# Patient Record
Sex: Female | Born: 1980 | Race: White | Hispanic: No | Marital: Married | Smoking: Never smoker
Health system: Southern US, Community
[De-identification: ages and names within clinical notes are randomized; demographics above are authoritative.]

## PROBLEM LIST (undated history)

## (undated) ENCOUNTER — Inpatient Hospital Stay (HOSPITAL_COMMUNITY): Payer: BC Managed Care – PPO

## (undated) ENCOUNTER — Inpatient Hospital Stay (HOSPITAL_COMMUNITY): Payer: Self-pay

## (undated) DIAGNOSIS — R519 Headache, unspecified: Secondary | ICD-10-CM

## (undated) DIAGNOSIS — D649 Anemia, unspecified: Secondary | ICD-10-CM

## (undated) DIAGNOSIS — R51 Headache: Secondary | ICD-10-CM

## (undated) DIAGNOSIS — M629 Disorder of muscle, unspecified: Secondary | ICD-10-CM

## (undated) DIAGNOSIS — M2420 Disorder of ligament, unspecified site: Secondary | ICD-10-CM

## (undated) DIAGNOSIS — F329 Major depressive disorder, single episode, unspecified: Secondary | ICD-10-CM

## (undated) DIAGNOSIS — R112 Nausea with vomiting, unspecified: Secondary | ICD-10-CM

## (undated) DIAGNOSIS — F32A Depression, unspecified: Secondary | ICD-10-CM

## (undated) DIAGNOSIS — I341 Nonrheumatic mitral (valve) prolapse: Secondary | ICD-10-CM

## (undated) DIAGNOSIS — Z8619 Personal history of other infectious and parasitic diseases: Secondary | ICD-10-CM

## (undated) DIAGNOSIS — O9902 Anemia complicating childbirth: Secondary | ICD-10-CM

## (undated) DIAGNOSIS — N390 Urinary tract infection, site not specified: Secondary | ICD-10-CM

## (undated) DIAGNOSIS — Z8659 Personal history of other mental and behavioral disorders: Secondary | ICD-10-CM

## (undated) DIAGNOSIS — Z9889 Other specified postprocedural states: Secondary | ICD-10-CM

## (undated) DIAGNOSIS — F50029 Anorexia nervosa, binge eating/purging type, unspecified: Secondary | ICD-10-CM

## (undated) DIAGNOSIS — F5002 Anorexia nervosa, binge eating/purging type: Secondary | ICD-10-CM

## (undated) DIAGNOSIS — D6852 Prothrombin gene mutation: Secondary | ICD-10-CM

## (undated) HISTORY — DX: Anorexia nervosa, binge eating/purging type: F50.02

## (undated) HISTORY — DX: Depression, unspecified: F32.A

## (undated) HISTORY — DX: Disorder of ligament, unspecified site: M24.20

## (undated) HISTORY — PX: WISDOM TOOTH EXTRACTION: SHX21

## (undated) HISTORY — DX: Urinary tract infection, site not specified: N39.0

## (undated) HISTORY — DX: Prothrombin gene mutation: D68.52

## (undated) HISTORY — DX: Personal history of other infectious and parasitic diseases: Z86.19

## (undated) HISTORY — DX: Personal history of other mental and behavioral disorders: Z86.59

## (undated) HISTORY — DX: Major depressive disorder, single episode, unspecified: F32.9

## (undated) HISTORY — DX: Anorexia nervosa, binge eating/purging type, unspecified: F50.029

## (undated) HISTORY — DX: Anemia, unspecified: D64.9

## (undated) HISTORY — DX: Disorder of muscle, unspecified: M62.9

---

## 2002-11-05 DIAGNOSIS — I341 Nonrheumatic mitral (valve) prolapse: Secondary | ICD-10-CM

## 2002-11-05 HISTORY — DX: Nonrheumatic mitral (valve) prolapse: I34.1

## 2010-08-28 ENCOUNTER — Ambulatory Visit: Payer: Self-pay | Admitting: Family Medicine

## 2010-08-28 DIAGNOSIS — M629 Disorder of muscle, unspecified: Secondary | ICD-10-CM

## 2010-09-26 ENCOUNTER — Ambulatory Visit: Payer: Self-pay | Admitting: Internal Medicine

## 2010-09-26 DIAGNOSIS — T7819XA Other adverse food reactions, not elsewhere classified, initial encounter: Secondary | ICD-10-CM | POA: Insufficient documentation

## 2010-09-26 DIAGNOSIS — T781XXA Other adverse food reactions, not elsewhere classified, initial encounter: Secondary | ICD-10-CM | POA: Insufficient documentation

## 2010-09-26 DIAGNOSIS — N912 Amenorrhea, unspecified: Secondary | ICD-10-CM

## 2010-09-26 LAB — CONVERTED CEMR LAB: Beta hcg, urine, semiquantitative: POSITIVE

## 2010-11-05 ENCOUNTER — Inpatient Hospital Stay (HOSPITAL_COMMUNITY)
Admission: AD | Admit: 2010-11-05 | Discharge: 2010-11-05 | Payer: Self-pay | Source: Home / Self Care | Attending: Obstetrics and Gynecology | Admitting: Obstetrics and Gynecology

## 2010-12-05 NOTE — Assessment & Plan Note (Signed)
Summary: 1 M F/U,PREG TEST DLO R/S FROM 11/23-DR. C   Vital Signs:  Patient profile:   30 year old female Height:      64.5 inches Weight:      112.25 pounds BMI:     19.04 Temp:     99.0 degrees F oral Pulse rate:   80 / minute Pulse rhythm:   regular BP sitting:   112 / 64  (left arm) Cuff size:   regular  Vitals Entered By: Selena Batten Dance CMA Duncan Dull) (September 26, 2010 11:30 AM) CC: f/u on knee, pregnancy test    History of Present Illness: CC: f/u IT band syndrome, pregnancy test, allergies  presents with boyfriend.  1. L knee - seen 1 mo ago and dx with IT band syndrome, limited to 2 miles/day.  did stretching/strenghtening  exercises recommended.  Used chopat strap.  Now feeling much better, has run without chopat strap for 2 consecutive runs, still limiting to 2 mi/day.  training for 1/2 marathon in February with sister.  has steady plan to increase mileage slowly to 1/2 marathon with cross training/strength training as well.  realized along with foot person at [employer] that she was using arch support insole on top of previous shoe insole and thinks this is where pain came from.  now using one insole at a time.  2. late - LMP 08/20/2010.  2 wks late.  positive pregnancy test at home, would like rpt here.  first pregnancy.  not on prenatals.  desires pregnancy.  not in plan but has talked w/ boyfirend about possiblity.  planning on getting married.  no cats at home, does eat sliced [place] but not too many other cold cuts.  advised to cut back EtOH, not heavy caffeine drinker, non smoker.  3. allergies - some meat causes her to break out in hives.  ?additive in processed meat or cheaply prepared meat.  ? pork sausage vs beef.  + eyes swell shut.  + abd pain as well as skin pain from hives.  No SOB, trouble breathing or throat swelling.  + anxiety from this.  running/sweating seems to trigger reaction.    Sees Dr. Richardine Service at [facility] for GYN but she doesn't do OB, requests referral to  Raquel Sarna, female.  wants to deliver at [facility]  Current Medications (verified): 1)  None  Allergies: 1)  ! Sulfa  Past History:  Past medical, surgical, family and social histories (including risk factors) reviewed for relevance to current acute and chronic problems.  Past Medical History: Reviewed history from 08/28/2010 and no changes required. 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 n/c  Family History: Reviewed history from 08/28/2010 and no changes required. 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  Teeth in good repair.  MMM Neck:  No deformities, masses, or tenderness noted. Lungs:  Normal respiratory effort, chest expands symmetrically. Lungs are clear to auscultation, no crackles or wheezes. Heart:  Normal rate and regular rhythm. S1 and S2 normal without gallop, murmur, click, rub or other extra sounds. Msk:  bilateral knees  no deformity, no crepitus, no tenderness to palpation.  Negative drawer and mcmurray's bilaterally.  no ligament instability.  negative FABER test bilat.   Pulses:  2+ rad pulses Extremities:  no pedal edema Neurologic:  CN grossly intact, station and gait intact. Skin:  Intact without suspicious lesions or rashes Psych:  full affect, pleasant conversant and alert/cooperative.   Impression & Recommendations:  Problem # 1:  PREGNANCY, PRIMIGRAVIDA (ICD-V22.0) LMP 08/20/2010.  referral to OB.  pt prefers female provider, prefers in [place] to deliver in [facility].  start prenatals,  push water.  no cat exposure.  no smoking, stop EtOH, no caffeine.  questions answered.  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  send to allergist for eval.  pt desires to find out what she is allergic to.    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  advised may slowly increase mileage to use pain as guide, back down if pain.  if persists, to return for eval.  healthy, should be able to do 1/2 marathon even while pregnant as tolerates, will defer to OB.  Complete Medication List: 1)  Prenatal 12 Chew (Prenatal vit-fe fumarate-fa) .... One daily  Other Orders: Urine Pregnancy Test  (16109)  Patient Instructions: 1)  Start prenatals, more water during day. 2)  referral OBGYN. 3)  referral to allergist for evaluation of ?food allergy. 4)  Slowly increase training - with cross training as well. 5)  Use pain as guide, if returning, slow it down.  If pain remains, return to be evaluated. Prescriptions: PRENATAL 19  CHEW (PRENATAL VIT-FE FUMARATE-FA) one daily  #90 x 1   Entered and Authorized by:   Eustaquio Boyden  MD   Signed by:   Eustaquio Boyden  MD on 09/26/2010   Method used:   Electronically to        [facility]  [address] [phone]* (retail)       [address]       [place], [place]  [address]       Ph: [phone]       Fax: [phone]   RxID:   [id number]    Orders Added: 1)  Obstetric Referral [Obstetric] 2)  Est. Patient Level IV [02725] 3)  Allergy Referral  [Allergy] 4)  Urine Pregnancy Test  [81025]    Prior Medications: Current Allergies (reviewed today): ! SULFA  Laboratory Results   Urine Tests  Date/Time Received: September 26, 2010 11:33 AM Date/Time Reported: September 26, 2010 11:33 AM    Urine HCG: positive

## 2010-12-05 NOTE — Letter (Signed)
Summary: Patient Questionnaire  Patient Questionnaire   Imported By: Beau Fanny 08/29/2010 10:06:28  _____________________________________________________________________  External Attachment:    Type:   Image     Comment:   External Document

## 2010-12-05 NOTE — Assessment & Plan Note (Signed)
Summary: KNEE PAIN/DLO   Vital Signs:  Patient profile:   30 year old female Height:      64.5 inches Weight:      116.0 pounds BMI:     19.67 Temp:     99.6 degrees F oral Pulse rate:   72 / minute Pulse rhythm:   regular BP sitting:   110 / 80  (left arm) Cuff size:   regular  Vitals Entered By: Benny Lennert CMA Duncan Dull) (August 28, 2010 1:53 PM)  History of Present Illness: Chief complaint new patient to be established with Left knee pain  30 year old female:  Training for a 1/2 marathon in Feb.  Increased mileage recently, was able to go on 7 miles or so on long runs Now, had to stop going up to 3 miles Really started to hurt laterally on about mile 1 1/2.  Enough so had to limp home.  Hurts from 45 - 90 degrees of knee motion, particularly at the end of run and at termination.  Tried some warm   No locking up, no effusion.  Lateral L kne pain.   10-15  DA's office  Allergies (verified): 1)  ! 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 Otherwise the pertinent positives of the ROS are noted above.    Physical Exam  General:  GEN: Well-developed,well-nourished,in no acute distress; alert,appropriate and cooperative throughout examination HEENT: Normocephalic and atraumatic without obvious abnormalities. No apparent alopecia or balding. Ears, externally no deformities PULM: Breathing comfortably in no respiratory distress EXT: No clubbing, cyanosis, or edema PSYCH:  Normally interactive. Cooperative during the interview. Pleasant. Friendly and conversant. Not anxious or depressed appearing. Normal, full affect.  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 Given rehab program from the [employer] on ITBS rehab - primarily ITB stretching program, core stability program, gluteus medius strengthening, hip flexion strengthening, and core.   Reviewed plan of care and rehab is the primary treatment in this condition.  ITBS band  Modifiy running, speed and track work, decreased mileage for now.  Respond based on pain  Patient Instructions: 1)  Knee up and hold: each leg, hold 10 sec, 20 reps 2)  Knee up and open hip: knee up and externally rotate hip to open position, hold 2 sec and repeat 3)  each leg, 30 reps 4)  Hip abduction and hip flexion: 5)  3 x 30 6)  Add ankle weights when able 7)  ITB strap (CHO-PAT - YOU CAN GET FROM [employer]) 8)  f/u 1 month   Orders Added: 1)  New Patient Level III [09811]    Prior Medications (reviewed today): None Current Allergies (reviewed today): ! SULFA

## 2010-12-06 DEATH — deceased

## 2011-01-15 LAB — CBC
HCT: 35.6 % — ABNORMAL LOW (ref 36.0–46.0)
Hemoglobin: 12.5 g/dL (ref 12.0–15.0)
MCV: 90.8 fL (ref 78.0–100.0)
WBC: 7.1 10*3/uL (ref 4.0–10.5)

## 2011-01-15 LAB — TYPE AND SCREEN: Antibody Screen: NEGATIVE

## 2011-01-15 LAB — RAPID URINE DRUG SCREEN, HOSP PERFORMED: Barbiturates: NOT DETECTED

## 2011-01-15 LAB — ABO/RH: ABO/RH(D): O POS

## 2011-11-06 NOTE — L&D Delivery Note (Signed)
Delivery Note At 3:45 PM a viable and healthy female was delivered via Vaginal, Spontaneous Delivery (Presentation: Left Occiput Anterior).  APGAR: 9, 9; weight pending.   Placenta status: Intact, Spontaneous.  Cord:  with the following complications: None.  Cord pH: na  Anesthesia: Epidural  Episiotomy: None Lacerations: 3rd degree Suture Repair: 2.0 3.0 vicryl rapide and O Vicryl on sphincter Est. Blood Loss (mL): 500  Mom to postpartum.  Baby to nursery-stable.  Kelliann Pendergraph J 07/20/2012, 4:16 PM

## 2011-12-12 LAB — OB RESULTS CONSOLE GC/CHLAMYDIA
Chlamydia: NEGATIVE
Gonorrhea: NEGATIVE

## 2011-12-12 LAB — OB RESULTS CONSOLE RPR: RPR: NONREACTIVE

## 2011-12-12 LAB — OB RESULTS CONSOLE HIV ANTIBODY (ROUTINE TESTING): HIV: NONREACTIVE

## 2012-03-05 ENCOUNTER — Inpatient Hospital Stay (HOSPITAL_COMMUNITY): Admission: AD | Admit: 2012-03-05 | Payer: Self-pay | Source: Ambulatory Visit | Admitting: Obstetrics and Gynecology

## 2012-03-17 ENCOUNTER — Encounter: Payer: Self-pay | Admitting: Family Medicine

## 2012-03-18 ENCOUNTER — Ambulatory Visit (INDEPENDENT_AMBULATORY_CARE_PROVIDER_SITE_OTHER): Payer: BC Managed Care – PPO | Admitting: Family Medicine

## 2012-03-18 ENCOUNTER — Encounter: Payer: Self-pay | Admitting: Family Medicine

## 2012-03-18 VITALS — BP 112/64 | HR 84 | Temp 98.4°F | Wt 138.2 lb

## 2012-03-18 DIAGNOSIS — O26899 Other specified pregnancy related conditions, unspecified trimester: Secondary | ICD-10-CM

## 2012-03-18 DIAGNOSIS — S93409A Sprain of unspecified ligament of unspecified ankle, initial encounter: Secondary | ICD-10-CM

## 2012-03-18 DIAGNOSIS — R252 Cramp and spasm: Secondary | ICD-10-CM | POA: Insufficient documentation

## 2012-03-18 LAB — POTASSIUM: Potassium: 4 mEq/L (ref 3.5–5.1)

## 2012-03-18 LAB — MAGNESIUM: Magnesium: 1.8 mg/dL (ref 1.5–2.5)

## 2012-03-18 NOTE — Progress Notes (Signed)
  Subjective:    Patient ID: Chelsea Allen, female    DOB: [birth year], 31 y.o.   MRN: [id number]  HPI CC: left ankle pain  2 mo ago walking to mailbox, twisted ankle and fell down.  Remained swollen for several weeks.  Last week fell again, twisted same ankle.  Currently no pain but does stay more sensitive than otherwise.  Not taking any meds, didn't ice or raise leg.  First time did wrap for several days.  Also getting charlie horse sensation for last week at night in right calf.  Wakes her up, then sore the next day.  Happening every few days.  Review of Systems Per HPI    Objective:   Physical Exam  Nursing note and vitals reviewed. Constitutional: She appears well-developed and well-nourished. No distress.  Musculoskeletal: Normal range of motion. She exhibits no edema.       No palpable cords.  No calf tenderness. Tender to palpation left ATFL.  No ligament laxity noted with exam. Neg talar tilt. Neg squeeze test. No pain at base of 5th MT.  No pain at malleoli or at navicular.  Skin: Skin is warm and dry. No rash noted.  Psychiatric: She has a normal mood and affect.      Assessment & Plan:

## 2012-03-18 NOTE — Patient Instructions (Signed)
For ankle sprain - use ASO brace for next 3-4 weeks whenever active.  Do stretching exercises provided.  Ok to use tylenol during pregnancy if needed. For leg cramps - more fruits/vegetables.  Will check potassium and magnesium levels today. Let us know if not better.

## 2012-03-18 NOTE — Assessment & Plan Note (Signed)
Consistent with left lateral ankle sprain - ATFL. Resolving on own, but second sprain in matter of months. Discussed stretching/strengthening as well as working on proprioception but as 4 mo pregnant did not recommend balance training. Will place in ASO brace. Provided with stretching exercises from [facility] pt advisor.

## 2012-03-18 NOTE — Assessment & Plan Note (Signed)
Recommended more fruits/vegetables. Check K and Mg today.

## 2012-07-08 ENCOUNTER — Inpatient Hospital Stay (HOSPITAL_COMMUNITY)
Admission: AD | Admit: 2012-07-08 | Payer: BC Managed Care – PPO | Source: Ambulatory Visit | Admitting: Obstetrics and Gynecology

## 2012-07-16 ENCOUNTER — Telehealth (HOSPITAL_COMMUNITY): Payer: Self-pay | Admitting: *Deleted

## 2012-07-16 ENCOUNTER — Encounter (HOSPITAL_COMMUNITY): Payer: Self-pay | Admitting: *Deleted

## 2012-07-16 NOTE — Telephone Encounter (Signed)
Preadmission screen  

## 2012-07-17 ENCOUNTER — Other Ambulatory Visit: Payer: Self-pay | Admitting: Obstetrics and Gynecology

## 2012-07-19 ENCOUNTER — Inpatient Hospital Stay (HOSPITAL_COMMUNITY)
Admission: RE | Admit: 2012-07-19 | Discharge: 2012-07-22 | DRG: 373 | Disposition: A | Discharge status: Home / Self Care | Payer: BC Managed Care – PPO | Source: Ambulatory Visit | Attending: Obstetrics and Gynecology | Admitting: Obstetrics and Gynecology

## 2012-07-19 ENCOUNTER — Encounter (HOSPITAL_COMMUNITY): Payer: Self-pay

## 2012-07-19 DIAGNOSIS — O480 Post-term pregnancy: Principal | ICD-10-CM | POA: Diagnosis present

## 2012-07-19 DIAGNOSIS — D62 Acute posthemorrhagic anemia: Secondary | ICD-10-CM | POA: Diagnosis present

## 2012-07-19 DIAGNOSIS — O9902 Anemia complicating childbirth: Secondary | ICD-10-CM | POA: Diagnosis present

## 2012-07-19 DIAGNOSIS — O99892 Other specified diseases and conditions complicating childbirth: Secondary | ICD-10-CM | POA: Diagnosis present

## 2012-07-19 DIAGNOSIS — J209 Acute bronchitis, unspecified: Secondary | ICD-10-CM | POA: Diagnosis present

## 2012-07-19 HISTORY — DX: Anemia complicating childbirth: O99.02

## 2012-07-19 LAB — CBC
MCH: 28.5 pg (ref 26.0–34.0)
MCHC: 33.2 g/dL (ref 30.0–36.0)
MCV: 85.6 fL (ref 78.0–100.0)
Platelets: 193 10*3/uL (ref 150–400)
RDW: 16.2 % — ABNORMAL HIGH (ref 11.5–15.5)

## 2012-07-19 MED ORDER — ACETAMINOPHEN 325 MG PO TABS: 650.0000 mg | ORAL_TABLET | ORAL | Status: DC | PRN

## 2012-07-19 MED ORDER — IBUPROFEN 600 MG PO TABS
600.0000 mg | ORAL_TABLET | Freq: Four times a day (QID) | ORAL | Status: DC | PRN
  Administered 2012-07-20: 600 mg via ORAL
  Filled 2012-07-19: qty 1

## 2012-07-19 MED ORDER — OXYTOCIN 40 UNITS IN LACTATED RINGERS INFUSION - SIMPLE MED
1.0000 m[IU]/min | INTRAVENOUS | Status: DC
  Administered 2012-07-20: 2 m[IU]/min via INTRAVENOUS
  Filled 2012-07-19: qty 1000

## 2012-07-19 MED ORDER — TERBUTALINE SULFATE 1 MG/ML IJ SOLN: 0.2500 mg | Freq: Once | INTRAMUSCULAR | Status: AC | PRN

## 2012-07-19 MED ORDER — OXYCODONE-ACETAMINOPHEN 5-325 MG PO TABS: 1.0000 | ORAL_TABLET | ORAL | Status: DC | PRN

## 2012-07-19 MED ORDER — OXYTOCIN 40 UNITS IN LACTATED RINGERS INFUSION - SIMPLE MED: 62.5000 mL/h | Freq: Once | INTRAVENOUS | Status: DC

## 2012-07-19 MED ORDER — FLEET ENEMA 7-19 GM/118ML RE ENEM: 1.0000 | ENEMA | RECTAL | Status: DC | PRN

## 2012-07-19 MED ORDER — ONDANSETRON HCL 4 MG/2ML IJ SOLN
4.0000 mg | Freq: Four times a day (QID) | INTRAMUSCULAR | Status: DC | PRN
  Administered 2012-07-20: 4 mg via INTRAVENOUS
  Filled 2012-07-19: qty 2

## 2012-07-19 MED ORDER — LACTATED RINGERS IV SOLN: 500.0000 mL | INTRAVENOUS | Status: DC | PRN

## 2012-07-19 MED ORDER — CITRIC ACID-SODIUM CITRATE 334-500 MG/5ML PO SOLN: 30.0000 mL | ORAL | Status: DC | PRN

## 2012-07-19 MED ORDER — GUAIFENESIN 100 MG/5ML PO SOLN: 10.0000 mL | ORAL | Status: DC | PRN

## 2012-07-19 MED ORDER — LIDOCAINE HCL (PF) 1 % IJ SOLN
30.0000 mL | INTRAMUSCULAR | Status: DC | PRN
  Administered 2012-07-20: 30 mL via SUBCUTANEOUS
  Filled 2012-07-19: qty 30

## 2012-07-19 MED ORDER — ZOLPIDEM TARTRATE 5 MG PO TABS: 5.0000 mg | ORAL_TABLET | Freq: Every evening | ORAL | Status: DC | PRN

## 2012-07-19 MED ORDER — MISOPROSTOL 25 MCG QUARTER TABLET
25.0000 ug | ORAL_TABLET | ORAL | Status: DC | PRN
  Administered 2012-07-19: 25 ug via VAGINAL
  Filled 2012-07-19: qty 0.25

## 2012-07-19 MED ORDER — GUAIFENESIN 100 MG/5ML PO SYRP
200.0000 mg | ORAL_SOLUTION | ORAL | Status: DC | PRN
  Filled 2012-07-19: qty 118

## 2012-07-19 MED ORDER — BUTORPHANOL TARTRATE 1 MG/ML IJ SOLN
1.0000 mg | INTRAMUSCULAR | Status: DC | PRN
Start: 2012-07-19 — End: 2012-07-20
  Administered 2012-07-20: 1 mg via INTRAVENOUS
  Filled 2012-07-19: qty 1

## 2012-07-19 MED ORDER — OXYTOCIN BOLUS FROM INFUSION
500.0000 mL | Freq: Once | INTRAVENOUS | Status: AC
  Administered 2012-07-20: 500 mL via INTRAVENOUS
  Filled 2012-07-19: qty 500

## 2012-07-19 MED ORDER — LACTATED RINGERS IV SOLN
INTRAVENOUS | Status: DC
  Administered 2012-07-19 – 2012-07-20 (×2): via INTRAVENOUS

## 2012-07-19 NOTE — Progress Notes (Signed)
Chelsea Allen is a 31 y.o. G2P0010 at [redacted]w[redacted]d by LMP admitted for induction of labor due to Post dates. Due date 9/2.  Subjective: comfortable  Objective: BP 121/72  Pulse 94  Resp 20  Ht 5\' 4"  (1.626 m)  Wt 72.576 kg (160 lb)  BMI 27.46 kg/m2      FHT:  FHR: 155 bpm, variability: moderate,  accelerations:  Present,  decelerations:  Absent UC:   none SVE:   Dilation: Fingertip Effacement (%): Thick Station: -3 Exam by:: c soliz rn  Labs: Lab Results  Component Value Date   WBC 7.1 11/05/2010   HGB 12.5 11/05/2010   HCT 35.6* 11/05/2010   MCV 90.8 11/05/2010   PLT 193 11/05/2010    Assessment / Plan: Postdates IUP  Labor: Progressing normally Preeclampsia:  na Fetal Wellbeing:  Category I Pain Control:  Labor support without medications I/D:  n/a Anticipated MOD:  NSVD  Alvita Fana J 07/19/2012, 9:08 PM

## 2012-07-20 ENCOUNTER — Encounter (HOSPITAL_COMMUNITY): Payer: Self-pay

## 2012-07-20 ENCOUNTER — Encounter (HOSPITAL_COMMUNITY): Payer: Self-pay | Admitting: Anesthesiology

## 2012-07-20 ENCOUNTER — Inpatient Hospital Stay (HOSPITAL_COMMUNITY): Payer: BC Managed Care – PPO | Admitting: Anesthesiology

## 2012-07-20 LAB — RPR: RPR Ser Ql: NONREACTIVE

## 2012-07-20 MED ORDER — TETANUS-DIPHTH-ACELL PERTUSSIS 5-2.5-18.5 LF-MCG/0.5 IM SUSP: 0.5000 mL | Freq: Once | INTRAMUSCULAR | Status: DC

## 2012-07-20 MED ORDER — DIBUCAINE 1 % RE OINT: 1.0000 "application " | TOPICAL_OINTMENT | RECTAL | Status: DC | PRN

## 2012-07-20 MED ORDER — EPHEDRINE 5 MG/ML INJ
10.0000 mg | INTRAVENOUS | Status: DC | PRN
  Filled 2012-07-20: qty 4

## 2012-07-20 MED ORDER — PHENYLEPHRINE 40 MCG/ML (10ML) SYRINGE FOR IV PUSH (FOR BLOOD PRESSURE SUPPORT)
80.0000 ug | PREFILLED_SYRINGE | INTRAVENOUS | Status: DC | PRN
  Filled 2012-07-20: qty 5

## 2012-07-20 MED ORDER — SENNOSIDES-DOCUSATE SODIUM 8.6-50 MG PO TABS
2.0000 | ORAL_TABLET | Freq: Every day | ORAL | Status: DC
  Administered 2012-07-21: 2 via ORAL

## 2012-07-20 MED ORDER — SIMETHICONE 80 MG PO CHEW: 80.0000 mg | CHEWABLE_TABLET | ORAL | Status: DC | PRN

## 2012-07-20 MED ORDER — DIPHENHYDRAMINE HCL 50 MG/ML IJ SOLN: 12.5000 mg | INTRAMUSCULAR | Status: DC | PRN

## 2012-07-20 MED ORDER — BENZOCAINE-MENTHOL 20-0.5 % EX AERO
1.0000 "application " | INHALATION_SPRAY | CUTANEOUS | Status: DC | PRN
  Administered 2012-07-22: 1 via TOPICAL
  Filled 2012-07-20 (×2): qty 56

## 2012-07-20 MED ORDER — ZOLPIDEM TARTRATE 5 MG PO TABS: 5.0000 mg | ORAL_TABLET | Freq: Every evening | ORAL | Status: DC | PRN

## 2012-07-20 MED ORDER — DIPHENHYDRAMINE HCL 25 MG PO CAPS: 25.0000 mg | ORAL_CAPSULE | Freq: Four times a day (QID) | ORAL | Status: DC | PRN

## 2012-07-20 MED ORDER — EPHEDRINE 5 MG/ML INJ: 10.0000 mg | INTRAVENOUS | Status: DC | PRN

## 2012-07-20 MED ORDER — ONDANSETRON HCL 4 MG/2ML IJ SOLN: 4.0000 mg | INTRAMUSCULAR | Status: DC | PRN

## 2012-07-20 MED ORDER — METHYLERGONOVINE MALEATE 0.2 MG PO TABS: 0.2000 mg | ORAL_TABLET | ORAL | Status: DC | PRN

## 2012-07-20 MED ORDER — IBUPROFEN 600 MG PO TABS
600.0000 mg | ORAL_TABLET | Freq: Four times a day (QID) | ORAL | Status: DC
  Administered 2012-07-20 – 2012-07-21 (×2): 600 mg via ORAL
  Filled 2012-07-20 (×2): qty 1

## 2012-07-20 MED ORDER — WITCH HAZEL-GLYCERIN EX PADS: 1.0000 "application " | MEDICATED_PAD | CUTANEOUS | Status: DC | PRN

## 2012-07-20 MED ORDER — LIDOCAINE HCL (PF) 1 % IJ SOLN
INTRAMUSCULAR | Status: DC | PRN
  Administered 2012-07-20 (×2): 5 mL

## 2012-07-20 MED ORDER — FENTANYL 2.5 MCG/ML BUPIVACAINE 1/10 % EPIDURAL INFUSION (WH - ANES)
INTRAMUSCULAR | Status: DC | PRN
  Administered 2012-07-20: 14 mL/h via EPIDURAL

## 2012-07-20 MED ORDER — LACTATED RINGERS IV SOLN
500.0000 mL | Freq: Once | INTRAVENOUS | Status: AC
  Administered 2012-07-20: 1000 mL via INTRAVENOUS

## 2012-07-20 MED ORDER — ONDANSETRON HCL 4 MG PO TABS: 4.0000 mg | ORAL_TABLET | ORAL | Status: DC | PRN

## 2012-07-20 MED ORDER — METHYLERGONOVINE MALEATE 0.2 MG/ML IJ SOLN: 0.2000 mg | INTRAMUSCULAR | Status: DC | PRN

## 2012-07-20 MED ORDER — FENTANYL 2.5 MCG/ML BUPIVACAINE 1/10 % EPIDURAL INFUSION (WH - ANES)
14.0000 mL/h | INTRAMUSCULAR | Status: DC
  Administered 2012-07-20 (×2): 14 mL/h via EPIDURAL
  Filled 2012-07-20 (×3): qty 60

## 2012-07-20 MED ORDER — PHENYLEPHRINE 40 MCG/ML (10ML) SYRINGE FOR IV PUSH (FOR BLOOD PRESSURE SUPPORT): 80.0000 ug | PREFILLED_SYRINGE | INTRAVENOUS | Status: DC | PRN

## 2012-07-20 MED ORDER — PRENATAL MULTIVITAMIN CH
1.0000 | ORAL_TABLET | Freq: Every day | ORAL | Status: DC
  Administered 2012-07-21 – 2012-07-22 (×2): 1 via ORAL
  Filled 2012-07-20: qty 1

## 2012-07-20 MED ORDER — OXYCODONE-ACETAMINOPHEN 5-325 MG PO TABS: 1.0000 | ORAL_TABLET | ORAL | Status: DC | PRN

## 2012-07-20 MED ORDER — LANOLIN HYDROUS EX OINT: TOPICAL_OINTMENT | CUTANEOUS | Status: DC | PRN

## 2012-07-20 NOTE — Anesthesia Procedure Notes (Signed)
Epidural Patient location during procedure: OB Start time: 07/20/2012 3:35 AM  Staffing Anesthesiologist: Brayton Caves R Performed by: anesthesiologist   Preanesthetic Checklist Completed: patient identified, site marked, surgical consent, pre-op evaluation, timeout performed, IV checked, risks and benefits discussed and monitors and equipment checked  Epidural Patient position: sitting Prep: site prepped and draped and DuraPrep Patient monitoring: continuous pulse ox and blood pressure Approach: midline Injection technique: LOR air and LOR saline  Needle:  Needle type: Tuohy  Needle gauge: 17 G Needle length: 9 cm and 9 Needle insertion depth: 5 cm cm Catheter type: closed end flexible Catheter size: 19 Gauge Catheter at skin depth: 10 cm Test dose: negative  Assessment Events: blood not aspirated, injection not painful, no injection resistance, negative IV test and no paresthesia  Additional Notes Patient identified.  Risk benefits discussed including failed block, incomplete pain control, headache, nerve damage, paralysis, blood pressure changes, nausea, vomiting, reactions to medication both toxic or allergic, and postpartum back pain.  Patient expressed understanding and wished to proceed.  All questions were answered.  Sterile technique used throughout procedure and epidural site dressed with sterile barrier dressing. No paresthesia or other complications noted.The patient did not experience any signs of intravascular injection such as tinnitus or metallic taste in mouth nor signs of intrathecal spread such as rapid motor block. Please see nursing notes for vital signs.

## 2012-07-20 NOTE — Progress Notes (Signed)
Chelsea Allen is a 31 y.o. G2P0010 at [redacted]w[redacted]d by LMP admitted for induction of labor due to Post dates. Due date 9/4.  Subjective: Comfortable  Objective: BP 71/39  Pulse 108  Temp 97.7 F (36.5 C) (Oral)  Resp 18  Ht 5\' 4"  (1.626 m)  Wt 72.576 kg (160 lb)  BMI 27.46 kg/m2  SpO2 99%      FHT:  FHR: 155 bpm, variability: moderate,  accelerations:  Present,  decelerations:  Absent UC:   regular, every 2-3 minutes SVE:   Dilation: 8 Effacement (%): 100 Station: +1 Exam by:: Dorice Stiggers AROM-clear  Labs: Lab Results  Component Value Date   WBC 10.6* 07/19/2012   HGB 10.9* 07/19/2012   HCT 32.8* 07/19/2012   MCV 85.6 07/19/2012   PLT 193 07/19/2012    Assessment / Plan: Induction of labor due to postterm,  progressing well on pitocin  Labor: Progressing normally Preeclampsia:  na Fetal Wellbeing:  Category I Pain Control:  Epidural I/D:  n/a Anticipated MOD:  NSVD  Virl Coble J 07/20/2012, 12:58 PM

## 2012-07-20 NOTE — Anesthesia Postprocedure Evaluation (Signed)
  Anesthesia Post-op Note  Patient: Chelsea Allen  Procedure(s) Performed: * No procedures listed *  Patient Location: PACU and Mother/Baby  Anesthesia Type: Epidural  Level of Consciousness: awake, alert  and oriented  Airway and Oxygen Therapy: Patient Spontanous Breathing    Post-op Assessment: Patient's Cardiovascular Status Stable and Respiratory Function Stable  Post-op Vital Signs: stable  Complications: No apparent anesthesia complications

## 2012-07-20 NOTE — Anesthesia Preprocedure Evaluation (Signed)
Anesthesia Evaluation  Patient identified by MRN, date of birth, ID band Patient awake    Reviewed: Allergy & Precautions, H&P , Patient's Chart, lab work & pertinent test results  Airway Mallampati: II TM Distance: >3 FB Neck ROM: full    Dental No notable dental hx.    Pulmonary neg pulmonary ROS,  breath sounds clear to auscultation  Pulmonary exam normal       Cardiovascular negative cardio ROS  Rhythm:regular Rate:Normal     Neuro/Psych negative neurological ROS  negative psych ROS   GI/Hepatic negative GI ROS, Neg liver ROS,   Endo/Other  negative endocrine ROS  Renal/GU negative Renal ROS     Musculoskeletal   Abdominal   Peds  Hematology negative hematology ROS (+)   Anesthesia Other Findings Anorexia nervosa with bulimia in college none in 10+ years Other disorder of muscle, ligament, and fascia   IITS-left knee    Urinary tract infection, site not specified     Anemia        H/O varicella     Depression        Personal history of other mental disorder    Reproductive/Obstetrics (+) Pregnancy                           Anesthesia Physical Anesthesia Plan  ASA: II  Anesthesia Plan: Epidural   Post-op Pain Management:    Induction:   Airway Management Planned:   Additional Equipment:   Intra-op Plan:   Post-operative Plan:   Informed Consent: I have reviewed the patients History and Physical, chart, labs and discussed the procedure including the risks, benefits and alternatives for the proposed anesthesia with the patient or authorized representative who has indicated his/her understanding and acceptance.     Plan Discussed with:   Anesthesia Plan Comments:         Anesthesia Quick Evaluation

## 2012-07-21 LAB — CBC
MCH: 27.9 pg (ref 26.0–34.0)
MCV: 86.2 fL (ref 78.0–100.0)
Platelets: 170 10*3/uL (ref 150–400)
RDW: 16.9 % — ABNORMAL HIGH (ref 11.5–15.5)

## 2012-07-21 MED ORDER — SALINE SPRAY 0.65 % NA SOLN
1.0000 | NASAL | Status: DC | PRN
  Filled 2012-07-21: qty 44

## 2012-07-21 MED ORDER — DOCUSATE SODIUM 100 MG PO CAPS
100.0000 mg | ORAL_CAPSULE | Freq: Two times a day (BID) | ORAL | Status: DC
  Administered 2012-07-21 – 2012-07-22 (×2): 100 mg via ORAL
  Filled 2012-07-21 (×2): qty 1

## 2012-07-21 MED ORDER — IBUPROFEN 800 MG PO TABS
800.0000 mg | ORAL_TABLET | Freq: Three times a day (TID) | ORAL | Status: DC
  Administered 2012-07-21 – 2012-07-22 (×3): 800 mg via ORAL
  Filled 2012-07-21 (×3): qty 1

## 2012-07-21 MED ORDER — HYDROCOD POLST-CHLORPHEN POLST 10-8 MG/5ML PO LQCR
5.0000 mL | Freq: Two times a day (BID) | ORAL | Status: DC
  Administered 2012-07-21 – 2012-07-22 (×2): 5 mL via ORAL
  Filled 2012-07-21 (×3): qty 5

## 2012-07-21 MED ORDER — ALBUTEROL SULFATE HFA 108 (90 BASE) MCG/ACT IN AERS
2.0000 | INHALATION_SPRAY | RESPIRATORY_TRACT | Status: DC | PRN
  Administered 2012-07-21 – 2012-07-22 (×3): 2 via RESPIRATORY_TRACT
  Filled 2012-07-21: qty 6.7

## 2012-07-21 MED ORDER — TRAMADOL HCL 50 MG PO TABS
50.0000 mg | ORAL_TABLET | Freq: Four times a day (QID) | ORAL | Status: DC | PRN
  Administered 2012-07-21 – 2012-07-22 (×2): 50 mg via ORAL
  Filled 2012-07-21 (×2): qty 1

## 2012-07-21 MED ORDER — POLYSACCHARIDE IRON COMPLEX 150 MG PO CAPS
150.0000 mg | ORAL_CAPSULE | Freq: Every day | ORAL | Status: DC
  Administered 2012-07-22: 150 mg via ORAL
  Filled 2012-07-21 (×2): qty 1

## 2012-07-21 NOTE — Progress Notes (Signed)
PPD 1 SVD with 3rd degree -partial 4th  S:  Reports feeling tired and really sore             Tolerating po/ No nausea or vomiting             Bleeding is light             Pain controlled with motrin but wearing off early - does not want percocet (too sedating)             Up ad lib / ambulatory  Newborn breast feeding  / Circumcision - unsure - considering   O:  A & O x 3              VS: Blood pressure 111/69, pulse 83, temperature 98.7 F (37.1 C), temperature source Axillary, resp. rate 18, height 5\' 4"  (1.626 m), weight 72.576 kg (160 lb), SpO2 99.00%, unknown if currently breastfeeding.  LABS: WBC/Hgb/Hct/Plts:  18.5/8.5/26.3/170 (09/16 0500)   Lungs: Clear and unlabored  Heart: regular rate and rhythm  Abdomen: soft, non-tender, non-distended              Fundus: firm, non-tender, U-1  Perineum: moderate edema / ice pack in place  Lochia: light  Extremities: no edema, no calf pain or tenderness    A: PPD # 1 SVD with 3rd and partial 4th degree LAC             URI - likely viral etiology - acute onset with cough             ABL anemia from childbirth             Inadequate pain control  Doing well - stable status  P:  Routine post partum orders  Start colace 100 mg BID to TID x 6 weeks - avoid constipation / Magnesium 200-400 mg OTC at discharge             Increase water intake             Iron - slow release every other day x 1 month             Tussionex for cough every 12 hours / saline spray for nasal congestion             Increase motrin dose to 800 mg and changed percocet to ultram for pain  Marlinda Mike CNM, MSN 07/21/2012, 9:17 AM

## 2012-07-22 ENCOUNTER — Encounter (HOSPITAL_COMMUNITY): Payer: Self-pay

## 2012-07-22 DIAGNOSIS — O9902 Anemia complicating childbirth: Secondary | ICD-10-CM | POA: Diagnosis present

## 2012-07-22 HISTORY — DX: Anemia complicating childbirth: O99.02

## 2012-07-22 MED ORDER — TRAMADOL HCL 50 MG PO TABS: 50.0000 mg | ORAL_TABLET | Freq: Four times a day (QID) | ORAL | Status: DC | PRN

## 2012-07-22 MED ORDER — IBUPROFEN 800 MG PO TABS: 800.0000 mg | ORAL_TABLET | Freq: Three times a day (TID) | ORAL | Status: DC

## 2012-07-22 MED ORDER — BREAST PUMP MISC: 1.0000 [IU] | Status: DC | PRN

## 2012-07-22 MED ORDER — INFLUENZA VIRUS VACC SPLIT PF IM SUSP
0.5000 mL | INTRAMUSCULAR | Status: AC | PRN
  Administered 2012-07-22: 0.5 mL via INTRAMUSCULAR
  Filled 2012-07-22: qty 0.5

## 2012-07-22 MED ORDER — DSS 100 MG PO CAPS: 100.0000 mg | ORAL_CAPSULE | Freq: Two times a day (BID) | ORAL | Status: DC

## 2012-07-22 MED ORDER — ALBUTEROL SULFATE HFA 108 (90 BASE) MCG/ACT IN AERS: 2.0000 | INHALATION_SPRAY | RESPIRATORY_TRACT | Status: DC | PRN

## 2012-07-22 MED ORDER — POLYSACCHARIDE IRON COMPLEX 150 MG PO CAPS: 150.0000 mg | ORAL_CAPSULE | Freq: Every day | ORAL | Status: DC

## 2012-07-22 NOTE — Progress Notes (Signed)
Patient does not still have iv or epidural cath.  They were taken out prior to 1900

## 2012-07-22 NOTE — Progress Notes (Signed)
Pt discharged before Sw could assess history of depression. 

## 2012-07-22 NOTE — Discharge Summary (Signed)
Obstetric Discharge Summary Reason for Admission: induction of labor and post dates Prenatal Procedures: NST and ultrasound Intrapartum Procedures: spontaneous vaginal delivery and epidural Postpartum Procedures: Flu vaccine Complications-Operative and Postpartum: 3rd degree laceration Hemoglobin  Date Value Range Status  07/21/2012 8.5* 12.0 - 15.0 g/dL Final     REPEATED TO VERIFY     DELTA CHECK NOTED     HCT  Date Value Range Status  07/21/2012 26.3* 36.0 - 46.0 % Final    Physical Exam:  General: alert, cooperative and no distress Lochia: appropriate Uterine Fundus: firm Incision: healing well, no dehiscence, no significant erythema DVT Evaluation: Negative Homan's sign. No significant calf/ankle edema.  Discharge Diagnoses: Term Pregnancy-delivered and acute bronchitis, materna anemia, symptomatic  Discharge Information: Date: 07/22/2012 Activity: pelvic rest Diet: routine Medications: PNV, Ibuprofen, Colace, Iron and Ultram Condition: stable Instructions: refer to practice specific booklet Discharge to: home Follow-up Information    Follow up with Lenoard Aden, MD. Schedule an appointment as soon as possible for a visit in 6 weeks.   Contact information:   [address] [place] [address] [phone]          Newborn Data: Live born female "Zane" Birth Weight: 8 lb 4.5 oz (3755 g) APGAR: 9, 9  Home with mother.  PAUL,DANIELA 07/22/2012, 9:54 AM

## 2012-07-22 NOTE — Progress Notes (Signed)
Post Partum Day #2            Information for the patient's newborn:  Agustina, Witzke [[id number]]  female   / circumcision done Feeding: breast  Subjective: No HA, SOB, CP, F/C, breast symptoms. Pain controlled in perineum. L rib pain w/ coughing worsening Tussionex wearing off too quickly, desires to resume codeine cough syrup. Normal vaginal bleeding, no clots.      Objective:  Temp:  [97.9 F (36.6 C)-98.6 F (37 C)] 97.9 F (36.6 C) (09/17 0629) Pulse Rate:  [73-83] 73  (09/17 0629) Resp:  [18] 18  (09/17 0629) BP: (108-118)/(64-78) 108/78 mmHg (09/17 0629)  No intake or output data in the 24 hours ending 07/22/12 0934     Basename 07/21/12 0500 07/19/12 2020  WBC 18.5* 10.6*  HGB 8.5* 10.9*  HCT 26.3* 32.8*  PLT 170 193    Blood type: O/Positive/-- (02/06 0000) Rubella: Immune (02/06 0000)    Physical Exam:  General: alert, cooperative and no distress Lungs: CTAB, mild wheezing over main bilat bronchi Uterine Fundus: firm Lochia: appropriate Perineum: repair intact, edema minimal DVT Evaluation: Negative Homan's sign. No significant calf/ankle edema.    Assessment/Plan: PPD # 2 / 31 y.o., G2P1011 S/P:induced vaginal   Active Problems:  Postpartum care following vaginal delivery w 3rd degree (9/15)  vaginal delivery w 3rd degree (9/15)  Maternal anemia, with delivery Acute bronchitis-persistent cough, minimal relief from Tussionex and saline drops. No evidence of pneumonia on clinical exam, MSK pain from strain in L lower rib margin   normal postpartum exam  Bowel regimen and stool softeners daily for 3rd deg tear  Oral Fe supplement x 4-6 wks  Resume cough syrup w/ codeine four persistent cough, pt to call if worsening s/s at home  Continue current postpartum care  D/C home   LOS: 3 days   PAUL,DANIELA, CNM, MSN 07/22/2012, 9:34 AM

## 2014-07-30 LAB — OB RESULTS CONSOLE RPR: RPR: NONREACTIVE

## 2014-07-30 LAB — OB RESULTS CONSOLE HEPATITIS B SURFACE ANTIGEN: Hepatitis B Surface Ag: NEGATIVE

## 2014-07-30 LAB — OB RESULTS CONSOLE ABO/RH: RH Type: POSITIVE

## 2014-07-30 LAB — OB RESULTS CONSOLE ANTIBODY SCREEN: ANTIBODY SCREEN: NEGATIVE

## 2014-07-30 LAB — OB RESULTS CONSOLE GC/CHLAMYDIA: Gonorrhea: NEGATIVE

## 2014-07-30 LAB — OB RESULTS CONSOLE RUBELLA ANTIBODY, IGM: RUBELLA: IMMUNE

## 2014-07-30 LAB — OB RESULTS CONSOLE HIV ANTIBODY (ROUTINE TESTING): HIV: NONREACTIVE

## 2014-09-06 ENCOUNTER — Encounter (HOSPITAL_COMMUNITY): Payer: Self-pay

## 2014-11-05 NOTE — L&D Delivery Note (Signed)
Delivery Note At 2:00 PM a viable and healthy female was delivered via Vaginal, Spontaneous Delivery (Presentation: Right Occiput Anterior).  APGAR: 9, 9; weight pending .   Placenta status: Intact, Spontaneous.  Cord: 3 vessels with the following complications: None.  Cord pH: na  Anesthesia: Epidural  Episiotomy:  none Lacerations:  Third degree Suture Repair: 3.0 vicryl rapide and 0 vicryl Est. Blood Loss (mL):  300  Mom to postpartum.  Baby to Couplet care / Skin to Skin.  Adelita Hone J 03/07/2015, 2:23 PM

## 2015-02-02 LAB — OB RESULTS CONSOLE GBS: STREP GROUP B AG: NEGATIVE

## 2015-03-07 ENCOUNTER — Inpatient Hospital Stay (HOSPITAL_COMMUNITY): Payer: BC Managed Care – PPO | Admitting: Anesthesiology

## 2015-03-07 ENCOUNTER — Encounter (HOSPITAL_COMMUNITY): Payer: Self-pay | Admitting: *Deleted

## 2015-03-07 ENCOUNTER — Inpatient Hospital Stay (HOSPITAL_COMMUNITY)
Admission: AD | Admit: 2015-03-07 | Discharge: 2015-03-08 | DRG: 988 | Disposition: A | Discharge status: Home / Self Care | Payer: BC Managed Care – PPO | Source: Ambulatory Visit | Attending: Obstetrics and Gynecology | Admitting: Obstetrics and Gynecology

## 2015-03-07 DIAGNOSIS — Z3A41 41 weeks gestation of pregnancy: Secondary | ICD-10-CM | POA: Diagnosis present

## 2015-03-07 DIAGNOSIS — O9902 Anemia complicating childbirth: Secondary | ICD-10-CM | POA: Diagnosis present

## 2015-03-07 DIAGNOSIS — O480 Post-term pregnancy: Secondary | ICD-10-CM | POA: Diagnosis present

## 2015-03-07 DIAGNOSIS — Z3483 Encounter for supervision of other normal pregnancy, third trimester: Secondary | ICD-10-CM | POA: Diagnosis present

## 2015-03-07 HISTORY — DX: Nonrheumatic mitral (valve) prolapse: I34.1

## 2015-03-07 LAB — TYPE AND SCREEN
ABO/RH(D): O POS
ANTIBODY SCREEN: NEGATIVE

## 2015-03-07 LAB — CBC
HCT: 31.8 % — ABNORMAL LOW (ref 36.0–46.0)
HEMOGLOBIN: 10.4 g/dL — AB (ref 12.0–15.0)
MCH: 26.7 pg (ref 26.0–34.0)
MCHC: 32.7 g/dL (ref 30.0–36.0)
MCV: 81.5 fL (ref 78.0–100.0)
Platelets: 168 10*3/uL (ref 150–400)
RBC: 3.9 MIL/uL (ref 3.87–5.11)
RDW: 15.7 % — ABNORMAL HIGH (ref 11.5–15.5)
WBC: 9.6 10*3/uL (ref 4.0–10.5)

## 2015-03-07 LAB — RPR: RPR: NONREACTIVE

## 2015-03-07 MED ORDER — OXYCODONE-ACETAMINOPHEN 5-325 MG PO TABS: 1.0000 | ORAL_TABLET | ORAL | Status: DC | PRN

## 2015-03-07 MED ORDER — ONDANSETRON HCL 4 MG/2ML IJ SOLN: 4.0000 mg | INTRAMUSCULAR | Status: DC | PRN

## 2015-03-07 MED ORDER — DIPHENHYDRAMINE HCL 25 MG PO CAPS: 25.0000 mg | ORAL_CAPSULE | Freq: Four times a day (QID) | ORAL | Status: DC | PRN

## 2015-03-07 MED ORDER — LIDOCAINE HCL (PF) 1 % IJ SOLN
30.0000 mL | INTRAMUSCULAR | Status: DC | PRN
  Filled 2015-03-07: qty 30

## 2015-03-07 MED ORDER — FLEET ENEMA 7-19 GM/118ML RE ENEM: 1.0000 | ENEMA | RECTAL | Status: DC | PRN

## 2015-03-07 MED ORDER — EPHEDRINE 5 MG/ML INJ
10.0000 mg | INTRAVENOUS | Status: DC | PRN
Start: 2015-03-07 — End: 2015-03-07
  Filled 2015-03-07: qty 2

## 2015-03-07 MED ORDER — LANOLIN HYDROUS EX OINT: TOPICAL_OINTMENT | CUTANEOUS | Status: DC | PRN

## 2015-03-07 MED ORDER — SIMETHICONE 80 MG PO CHEW: 80.0000 mg | CHEWABLE_TABLET | ORAL | Status: DC | PRN

## 2015-03-07 MED ORDER — LACTATED RINGERS IV SOLN
INTRAVENOUS | Status: DC
Start: 2015-03-07 — End: 2015-03-07

## 2015-03-07 MED ORDER — CITRIC ACID-SODIUM CITRATE 334-500 MG/5ML PO SOLN: 30.0000 mL | ORAL | Status: DC | PRN

## 2015-03-07 MED ORDER — BUTORPHANOL TARTRATE 1 MG/ML IJ SOLN
1.0000 mg | INTRAMUSCULAR | Status: DC | PRN
  Administered 2015-03-07: 1 mg via INTRAVENOUS
  Filled 2015-03-07: qty 1

## 2015-03-07 MED ORDER — DIPHENHYDRAMINE HCL 50 MG/ML IJ SOLN: 12.5000 mg | INTRAMUSCULAR | Status: DC | PRN

## 2015-03-07 MED ORDER — ACETAMINOPHEN 325 MG PO TABS: 650.0000 mg | ORAL_TABLET | ORAL | Status: DC | PRN

## 2015-03-07 MED ORDER — OXYCODONE-ACETAMINOPHEN 5-325 MG PO TABS: 2.0000 | ORAL_TABLET | ORAL | Status: DC | PRN

## 2015-03-07 MED ORDER — WITCH HAZEL-GLYCERIN EX PADS: 1.0000 "application " | MEDICATED_PAD | CUTANEOUS | Status: DC | PRN

## 2015-03-07 MED ORDER — OXYTOCIN 40 UNITS IN LACTATED RINGERS INFUSION - SIMPLE MED
62.5000 mL/h | INTRAVENOUS | Status: DC
  Filled 2015-03-07: qty 1000

## 2015-03-07 MED ORDER — PHENYLEPHRINE 40 MCG/ML (10ML) SYRINGE FOR IV PUSH (FOR BLOOD PRESSURE SUPPORT)
80.0000 ug | PREFILLED_SYRINGE | INTRAVENOUS | Status: DC | PRN
Start: 2015-03-07 — End: 2015-03-07
  Filled 2015-03-07: qty 20
  Filled 2015-03-07: qty 2

## 2015-03-07 MED ORDER — METHYLERGONOVINE MALEATE 0.2 MG/ML IJ SOLN: 0.2000 mg | INTRAMUSCULAR | Status: DC | PRN

## 2015-03-07 MED ORDER — SENNOSIDES-DOCUSATE SODIUM 8.6-50 MG PO TABS
2.0000 | ORAL_TABLET | ORAL | Status: DC
  Administered 2015-03-07: 2 via ORAL
  Filled 2015-03-07: qty 2

## 2015-03-07 MED ORDER — DIBUCAINE 1 % RE OINT: 1.0000 "application " | TOPICAL_OINTMENT | RECTAL | Status: DC | PRN

## 2015-03-07 MED ORDER — METHYLERGONOVINE MALEATE 0.2 MG PO TABS: 0.2000 mg | ORAL_TABLET | ORAL | Status: DC | PRN

## 2015-03-07 MED ORDER — OXYTOCIN BOLUS FROM INFUSION
500.0000 mL | INTRAVENOUS | Status: DC
  Administered 2015-03-07: 500 mL via INTRAVENOUS

## 2015-03-07 MED ORDER — ONDANSETRON HCL 4 MG/2ML IJ SOLN: 4.0000 mg | Freq: Four times a day (QID) | INTRAMUSCULAR | Status: DC | PRN

## 2015-03-07 MED ORDER — LACTATED RINGERS IV SOLN
500.0000 mL | INTRAVENOUS | Status: DC | PRN
  Administered 2015-03-07: 1000 mL via INTRAVENOUS

## 2015-03-07 MED ORDER — IBUPROFEN 600 MG PO TABS
600.0000 mg | ORAL_TABLET | Freq: Four times a day (QID) | ORAL | Status: DC
  Administered 2015-03-07 – 2015-03-08 (×5): 600 mg via ORAL
  Filled 2015-03-07 (×5): qty 1

## 2015-03-07 MED ORDER — TETANUS-DIPHTH-ACELL PERTUSSIS 5-2.5-18.5 LF-MCG/0.5 IM SUSP: 0.5000 mL | Freq: Once | INTRAMUSCULAR | Status: DC

## 2015-03-07 MED ORDER — PRENATAL MULTIVITAMIN CH
1.0000 | ORAL_TABLET | Freq: Every day | ORAL | Status: DC
  Administered 2015-03-08: 1 via ORAL
  Filled 2015-03-07: qty 1

## 2015-03-07 MED ORDER — FENTANYL 2.5 MCG/ML BUPIVACAINE 1/10 % EPIDURAL INFUSION (WH - ANES)
14.0000 mL/h | INTRAMUSCULAR | Status: DC | PRN
  Administered 2015-03-07: 14 mL/h via EPIDURAL
  Filled 2015-03-07: qty 125

## 2015-03-07 MED ORDER — BENZOCAINE-MENTHOL 20-0.5 % EX AERO
1.0000 "application " | INHALATION_SPRAY | CUTANEOUS | Status: DC | PRN
  Administered 2015-03-07: 1 via TOPICAL
  Filled 2015-03-07: qty 56

## 2015-03-07 MED ORDER — ONDANSETRON HCL 4 MG PO TABS: 4.0000 mg | ORAL_TABLET | ORAL | Status: DC | PRN

## 2015-03-07 MED ORDER — OXYCODONE-ACETAMINOPHEN 5-325 MG PO TABS
1.0000 | ORAL_TABLET | ORAL | Status: DC | PRN
  Filled 2015-03-07: qty 1

## 2015-03-07 MED ORDER — ZOLPIDEM TARTRATE 5 MG PO TABS: 5.0000 mg | ORAL_TABLET | Freq: Every evening | ORAL | Status: DC | PRN

## 2015-03-07 MED ORDER — ACETAMINOPHEN 325 MG PO TABS
650.0000 mg | ORAL_TABLET | ORAL | Status: DC | PRN
  Administered 2015-03-08: 650 mg via ORAL
  Filled 2015-03-07: qty 2

## 2015-03-07 NOTE — H&P (Signed)
Chelsea Allen is a 34 y.o. female presenting for labor.  Maternal Medical History:  Reason for admission: Contractions.   Contractions: Onset was 3-5 hours ago.   Frequency: regular.   Perceived severity is moderate.    Fetal activity: Perceived fetal activity is normal.   Last perceived fetal movement was within the past hour.    Prenatal complications: no prenatal complications Prenatal Complications - Diabetes: none.    OB History    Gravida Para Term Preterm AB TAB SAB Ectopic Multiple Living   3 1 1  1  1   1      Past Medical History  Diagnosis Date  . Anorexia nervosa with bulimia in college    none in 10+ years  . Other disorder of muscle, ligament, and fascia     IITS-left knee  . Urinary tract infection, site not specified   . Anemia   . H/O varicella   . Depression   . Personal history of other mental disorder     anorexia nervosa  . Maternal anemia, with delivery 07/22/2012  . Mitral valve prolapse 2004   Past Surgical History  Procedure Laterality Date  . Wisdom tooth extraction     Family History: family history includes Cancer in her paternal grandfather; Depression in her maternal grandfather, mother, and sister. Social History:  reports that she has never smoked. She has never used smokeless tobacco. She reports that she does not drink alcohol or use illicit drugs.   Prenatal Transfer Tool  Maternal Diabetes: No Genetic Screening: Normal Maternal Ultrasounds/Referrals: Normal Fetal Ultrasounds or other Referrals:  None Maternal Substance Abuse:  No Significant Maternal Medications:  None Significant Maternal Lab Results:  None Other Comments:  None  Review of Systems  Constitutional: Negative.   All other systems reviewed and are negative.   Dilation: 3 Effacement (%): 60 Station: -1 Exam by:: Normand SloopK. Jones, RN  Blood pressure 95/51, pulse 72, temperature 97.9 F (36.6 C), temperature source Oral, resp. rate 20, height 5\' 4"  (1.626 m),  weight 68.04 kg (150 lb), SpO2 100 %, unknown if currently breastfeeding. Maternal Exam:  Uterine Assessment: Contraction strength is moderate.  Contraction frequency is regular.   Abdomen: Patient reports no abdominal tenderness. Fetal presentation: vertex  Introitus: Normal vulva. Normal vagina.  Ferning test: negative.  Nitrazine test: negative. Amniotic fluid character: not assessed.  Pelvis: adequate for delivery.   Cervix: Cervix evaluated by digital exam.     Physical Exam  Constitutional: She is oriented to person, place, and time. She appears well-developed and well-nourished.  Neck: Normal range of motion. Neck supple.  Cardiovascular: Normal rate and regular rhythm.   Respiratory: Effort normal and breath sounds normal.  GI: Soft. Bowel sounds are normal.  Genitourinary: Vagina normal and uterus normal.  Musculoskeletal: Normal range of motion.  Neurological: She is alert and oriented to person, place, and time. She has normal reflexes.  Skin: Skin is warm and dry.  Psychiatric: She has a normal mood and affect. Her behavior is normal. Judgment and thought content normal.    Prenatal labs: ABO, Rh: --/--/O POS (05/02 0330) Antibody: NEG (05/02 0330) Rubella: Immune (09/25 0000) RPR: Nonreactive (09/25 0000)  HBsAg: Negative (09/25 0000)  HIV: Non-reactive (09/25 0000)  GBS: Negative (03/30 0000)   Assessment/Plan: Postdates IUP Early labor Admit Anticipate VD   Chelsea Allen 03/07/2015, 7:09 AM

## 2015-03-07 NOTE — Lactation Note (Signed)
This note was copied from the chart of Boy Veverly FellsMeredith Laningham. Lactation Consultation Note Initial visit at 7 hours of age.  Mom reports just finishing a good feeding.  Mom denies pain with latch.  FOB holding baby STS.  [facility] resources given and discussed.  Encouraged to feed with early cues on demand.  Early newborn behavior discussed.  Hand expression demonstrated with colostrum visible.  Mom to call for assist as needed.     Patient Name: Boy Veverly FellsMeredith Scahill BJYNW'GToday's Date: 03/07/2015 Reason for consult: Initial assessment   Maternal Data Has patient been taught Hand Expression?: Yes  Feeding Feeding Type: Breast Fed Length of feed: 15 min  LATCH Score/Interventions Latch: Repeated attempts needed to sustain latch, nipple held in mouth throughout feeding, stimulation needed to elicit sucking reflex. Intervention(s): Skin to skin;Waking techniques  Audible Swallowing: None Intervention(s): Skin to skin;Hand expression  Type of Nipple: Flat  Comfort (Breast/Nipple): Soft / non-tender     Hold (Positioning): Assistance needed to correctly position infant at breast and maintain latch.  LATCH Score: 5  Lactation Tools Discussed/Used     Consult Status Consult Status: Follow-up Date: 03/08/15 Follow-up type: In-patient    Beverely RisenShoptaw, Arvella MerlesJana Lynn 03/07/2015, 11:37 PM

## 2015-03-07 NOTE — Anesthesia Procedure Notes (Addendum)
Epidural Patient location during procedure: OB  Staffing Anesthesiologist: Beatrice Sehgal, CHRIS Performed by: anesthesiologist   Preanesthetic Checklist Completed: patient identified, surgical consent, pre-op evaluation, timeout performed, IV checked, risks and benefits discussed and monitors and equipment checked  Epidural Patient position: sitting Prep: site prepped and draped and DuraPrep Patient monitoring: heart rate, cardiac monitor, continuous pulse ox and blood pressure Approach: midline Location: L3-L4 Injection technique: LOR saline  Needle:  Needle type: Tuohy  Needle gauge: 17 G Needle length: 9 cm Needle insertion depth: 6 cm Catheter type: closed end flexible Catheter size: 19 Gauge Catheter at skin depth: 12 cm Test dose: negative and 2% lidocaine with Epi 1:200 K  Assessment Events: blood not aspirated, injection not painful, no injection resistance, negative IV test and no paresthesia  Additional Notes H+P and labs checked, risks and benefits discussed with the patient, consent obtained, procedure tolerated well and without complications.  Reason for block:procedure for pain   

## 2015-03-08 ENCOUNTER — Encounter (HOSPITAL_COMMUNITY): Payer: Self-pay | Admitting: *Deleted

## 2015-03-08 LAB — CBC
HEMATOCRIT: 24.8 % — AB (ref 36.0–46.0)
HEMOGLOBIN: 8.1 g/dL — AB (ref 12.0–15.0)
MCH: 26.7 pg (ref 26.0–34.0)
MCHC: 32.7 g/dL (ref 30.0–36.0)
MCV: 81.8 fL (ref 78.0–100.0)
Platelets: 147 10*3/uL — ABNORMAL LOW (ref 150–400)
RBC: 3.03 MIL/uL — ABNORMAL LOW (ref 3.87–5.11)
RDW: 16 % — ABNORMAL HIGH (ref 11.5–15.5)
WBC: 13.9 10*3/uL — ABNORMAL HIGH (ref 4.0–10.5)

## 2015-03-08 MED ORDER — IBUPROFEN 600 MG PO TABS
600.0000 mg | ORAL_TABLET | Freq: Four times a day (QID) | ORAL | Status: DC
Start: 2015-03-08 — End: 2016-11-27

## 2015-03-08 MED ORDER — TRAMADOL HCL 50 MG PO TABS: 50.0000 mg | ORAL_TABLET | Freq: Four times a day (QID) | ORAL | Status: DC | PRN

## 2015-03-08 NOTE — Progress Notes (Signed)
MOB was referred for history of depression/anxiety.  Referral is screened out by Clinical Social Worker because none of the following criteria appear to apply: -History of anxiety/depression during this pregnancy, or of post-partum depression. - Diagnosis of anxiety and/or depression within last 3 years or -MOB's symptoms are currently being treated with medication and/or therapy.  CSW completed chart review.  Per OB records, MOB has history of depression since 2009.  No acute or recent symptoms noted during the pregnancy.  MOB also presents with history of anorexia, but H&P from this admission documents that symptoms occurred during college (more than 10 years ago) and denied current issues. CSW screening out referral at this time due to historical nature of these symptoms.   Please contact the Clinical Social Worker if needs arise or upon MOB request.   Jodean Valade, LCSW [phone] 

## 2015-03-08 NOTE — Discharge Summary (Signed)
Obstetric Discharge Summary  Reason for Admission: onset of labor Prenatal Procedure: none Intrapartum Procedures: epidural / 3rd degree repair Postpartum Procedures: none Complications-Operative and Postpartum: ABL anemia - stable HEMOGLOBIN  Date Value Ref Range Status  03/08/2015 8.1* 12.0 - 15.0 g/dL Final    Comment:    DELTA CHECK NOTED REPEATED TO VERIFY    HCT  Date Value Ref Range Status  03/08/2015 24.8* 36.0 - 46.0 % Final    Physical Exam:  General: stable / NAD or pain Lochia: light to moderate Uterine Fundus: firm -1 DVT Evaluation: normal   Discharge Diagnoses: PPD 1 s/p SVD with 3rd degree repair  Discharge Information: Date: 03/08/2015 Activity: normal Diet: normal / avoid constipation - drink more water Medications: prenatal / motrin / magnesium / colace / tramadol / iron Condition: stable Instructions: WOB booklet Discharge to: home Follow-up Information    Follow up with Lenoard AdenAAVON,RICHARD J, MD. Schedule an appointment as soon as possible for a visit in 6 weeks.   Specialty:  Obstetrics and Gynecology   Contact information:   [address] Severe[address] [place] [address] [phone]       Newborn Data: Live born female  Birth Weight: 8 lb 10 oz (3912 g) APGAR: 9, 9  Home with parents  Chelsea Allen, Jacobo Moncrief 03/08/2015, 5:18 PM

## 2015-03-08 NOTE — Lactation Note (Signed)
This note was copied from the chart of Chelsea Veverly FellsMeredith Bauder. Lactation Consultation Note Mom wanted assistance w/latch, baby sleepy, BF for 10 min. Going to be circumcised. Mom has flat nipples, used NS for first baby. Fitted w/#16 NS, left #20NS for growing change in nipple. Shells given to wear in bra between feedings.  Gave vial to hand express colostrum if baby is to sleep after circumcision to BF. Encouraged STS. assisted mom in football hold for feeding. Had stated she didn't like it with her first baby. Mom cramping after BF. Informed to void before BF and if used football hold baby wouldn't be laying on abd. Asked to call LC this afternoon for BF assistance w/NS. Patient Name: Chelsea Allen XLKGM'WToday's Date: 03/08/2015 Reason for consult: Follow-up assessment;Difficult latch   Maternal Data Does the patient have breastfeeding experience prior to this delivery?: Yes  Feeding Feeding Type: Breast Fed Length of feed: 0 min  LATCH Score/Interventions Latch: Too sleepy or reluctant, no latch achieved, no sucking elicited. Intervention(s): Skin to skin;Waking techniques;Teach feeding cues Intervention(s): Breast massage;Breast compression  Audible Swallowing: None Intervention(s): Hand expression;Skin to skin  Type of Nipple: Flat Intervention(s): Shells;Hand pump  Comfort (Breast/Nipple): Soft / non-tender     Hold (Positioning): Assistance needed to correctly position infant at breast and maintain latch. Intervention(s): Breastfeeding basics reviewed;Support Pillows;Position options;Skin to skin  LATCH Score: 4  Lactation Tools Discussed/Used Tools: Shells;Nipple Dorris CarnesShields;Pump Nipple shield size: 16 Shell Type: Inverted Breast pump type: Manual WIC Program: No Pump Review: Setup, frequency, and cleaning;Milk Storage Initiated by:: RN Date initiated:: 03/08/15   Consult Status Consult Status: Follow-up Date: 03/08/15 (in pm) Follow-up type:  In-patient    Charyl DancerCARVER, Chelsea Allen 03/08/2015, 10:48 AM

## 2015-03-08 NOTE — Anesthesia Postprocedure Evaluation (Signed)
Anesthesia Post Note  Patient: Chelsea Allen  Procedure(s) Performed: * No procedures listed *  Anesthesia type: Epidural  Patient location: Mother/Baby  Post pain: Pain level controlled  Post assessment: Post-op Vital signs reviewed  Last Vitals:  Filed Vitals:   03/08/15 0445  BP: 111/80  Pulse: 89  Temp: 37.4 C  Resp: 18    Post vital signs: Reviewed  Level of consciousness:alert  Complications: No apparent anesthesia complications

## 2015-03-08 NOTE — Discharge Instructions (Signed)
AVOID constipation  - no hard poop or straining with BM  Magnesium 400mg  daily Take colace as needed 100-200mg  daily if stools hard Miralax if no BM in 3 days

## 2015-03-08 NOTE — Progress Notes (Signed)
PPD 1 SVD  S:  Reports feeling well             Tolerating po/ No nausea or vomiting             Bleeding is light             Pain controlled with motrin             Up ad lib / ambulatory / voiding QS  Newborn breast feeding  / Circumcision outpatient with urology O:               VS: BP 111/80 mmHg  Pulse 89  Temp(Src) 99.3 F (37.4 C) (Oral)  Resp 18  Ht 5\' 4"  (1.626 m)  Wt 68.04 kg (150 lb)  BMI 25.73 kg/m2  SpO2 100%  Breastfeeding? Unknown   LABS:              Recent Labs  03/07/15 0330 03/08/15 0530  WBC 9.6 13.9*  HGB 10.4* 8.1*  PLT 168 147*               Blood type: --/--/O POS (05/02 0330)  Rubella: Immune (09/25 0000)                     I&O: Intake/Output      05/02 0701 - 05/03 0700 05/03 0701 - 05/04 0700   Urine (mL/kg/hr) 450 (0.3)    Blood 347 (0.2)    Total Output 797     Net -797                      Physical Exam:             Alert and oriented X3  Lungs: Clear and unlabored  Heart: regular rate and rhythm / no mumurs  Abdomen: soft, non-tender, non-distended              Fundus: firm, non-tender, U-1  Perineum: no edema  Lochia: light  Extremities: no edema, no calf pain or tenderness    A: PPD # 1  Doing well - stable status  P: Routine post partum orders  Early dc this pmj  Chelsea Allen, Chelsea Allen CNM, MSN, FACNM 03/08/2015, 5:00 PM

## 2015-03-27 MED ORDER — FENTANYL 2.5 MCG/ML BUPIVACAINE 1/10 % EPIDURAL INFUSION (WH - ANES)
INTRAMUSCULAR | Status: DC | PRN
  Administered 2015-03-07: 12 mL/h via EPIDURAL

## 2015-03-27 MED ORDER — LIDOCAINE-EPINEPHRINE (PF) 2 %-1:200000 IJ SOLN
INTRAMUSCULAR | Status: DC | PRN
  Administered 2015-03-07: 3 mL

## 2015-03-27 MED ORDER — BUPIVACAINE HCL (PF) 0.25 % IJ SOLN
INTRAMUSCULAR | Status: DC | PRN
  Administered 2015-03-07 (×2): 4 mL

## 2015-03-27 NOTE — Anesthesia Preprocedure Evaluation (Signed)
Anesthesia Evaluation  Patient identified by MRN, date of birth, ID band Patient awake    History of Anesthesia Complications Negative for: history of anesthetic complications  Airway Mallampati: III  TM Distance: >3 FB Neck ROM: Full    Dental  (+) Teeth Intact   Pulmonary neg pulmonary ROS,  breath sounds clear to auscultation        Cardiovascular negative cardio ROS  Rhythm:Regular     Neuro/Psych Depression negative neurological ROS     GI/Hepatic negative GI ROS, Neg liver ROS,   Endo/Other  negative endocrine ROS  Renal/GU negative Renal ROS     Musculoskeletal   Abdominal   Peds  Hematology   Anesthesia Other Findings   Reproductive/Obstetrics (+) Pregnancy                             Anesthesia Physical Anesthesia Plan  ASA: II  Anesthesia Plan: Epidural   Post-op Pain Management:    Induction:   Airway Management Planned:   Additional Equipment:   Intra-op Plan:   Post-operative Plan:   Informed Consent: I have reviewed the patients History and Physical, chart, labs and discussed the procedure including the risks, benefits and alternatives for the proposed anesthesia with the patient or authorized representative who has indicated his/her understanding and acceptance.     Plan Discussed with: Anesthesiologist  Anesthesia Plan Comments:         Anesthesia Quick Evaluation

## 2016-11-26 ENCOUNTER — Encounter (HOSPITAL_COMMUNITY): Payer: Self-pay

## 2016-11-26 ENCOUNTER — Other Ambulatory Visit: Payer: Self-pay | Admitting: Obstetrics and Gynecology

## 2016-11-26 DIAGNOSIS — Z419 Encounter for procedure for purposes other than remedying health state, unspecified: Secondary | ICD-10-CM

## 2016-11-27 ENCOUNTER — Ambulatory Visit (HOSPITAL_COMMUNITY): Payer: BC Managed Care – PPO

## 2016-11-27 ENCOUNTER — Ambulatory Visit (HOSPITAL_COMMUNITY): Payer: BC Managed Care – PPO | Admitting: Anesthesiology

## 2016-11-27 ENCOUNTER — Encounter (HOSPITAL_COMMUNITY)
Admission: RE | Disposition: A | Discharge status: Home / Self Care | Payer: Self-pay | Source: Ambulatory Visit | Attending: Obstetrics and Gynecology

## 2016-11-27 ENCOUNTER — Ambulatory Visit (HOSPITAL_COMMUNITY)
Admission: RE | Admit: 2016-11-27 | Discharge: 2016-11-27 | Disposition: A | Discharge status: Home / Self Care | Payer: BC Managed Care – PPO | Source: Ambulatory Visit | Attending: Obstetrics and Gynecology | Admitting: Obstetrics and Gynecology

## 2016-11-27 ENCOUNTER — Encounter (HOSPITAL_COMMUNITY): Payer: Self-pay | Admitting: Anesthesiology

## 2016-11-27 DIAGNOSIS — Z3A13 13 weeks gestation of pregnancy: Secondary | ICD-10-CM | POA: Insufficient documentation

## 2016-11-27 DIAGNOSIS — Z818 Family history of other mental and behavioral disorders: Secondary | ICD-10-CM | POA: Diagnosis not present

## 2016-11-27 DIAGNOSIS — Z882 Allergy status to sulfonamides status: Secondary | ICD-10-CM | POA: Insufficient documentation

## 2016-11-27 DIAGNOSIS — O2620 Pregnancy care for patient with recurrent pregnancy loss, unspecified trimester: Secondary | ICD-10-CM | POA: Insufficient documentation

## 2016-11-27 DIAGNOSIS — Z801 Family history of malignant neoplasm of trachea, bronchus and lung: Secondary | ICD-10-CM | POA: Insufficient documentation

## 2016-11-27 DIAGNOSIS — F329 Major depressive disorder, single episode, unspecified: Secondary | ICD-10-CM | POA: Diagnosis not present

## 2016-11-27 DIAGNOSIS — Z91018 Allergy to other foods: Secondary | ICD-10-CM | POA: Diagnosis not present

## 2016-11-27 DIAGNOSIS — O029 Abnormal product of conception, unspecified: Secondary | ICD-10-CM

## 2016-11-27 DIAGNOSIS — I341 Nonrheumatic mitral (valve) prolapse: Secondary | ICD-10-CM | POA: Insufficient documentation

## 2016-11-27 DIAGNOSIS — Z79899 Other long term (current) drug therapy: Secondary | ICD-10-CM | POA: Insufficient documentation

## 2016-11-27 DIAGNOSIS — O021 Missed abortion: Secondary | ICD-10-CM | POA: Diagnosis present

## 2016-11-27 DIAGNOSIS — D649 Anemia, unspecified: Secondary | ICD-10-CM | POA: Diagnosis not present

## 2016-11-27 HISTORY — PX: DILATION AND EVACUATION: SHX1459

## 2016-11-27 HISTORY — DX: Headache: R51

## 2016-11-27 HISTORY — DX: Other specified postprocedural states: R11.2

## 2016-11-27 HISTORY — DX: Headache, unspecified: R51.9

## 2016-11-27 HISTORY — DX: Other specified postprocedural states: Z98.890

## 2016-11-27 LAB — CBC
HEMATOCRIT: 33.6 % — AB (ref 36.0–46.0)
HEMOGLOBIN: 11.6 g/dL — AB (ref 12.0–15.0)
MCH: 29.3 pg (ref 26.0–34.0)
MCHC: 34.5 g/dL (ref 30.0–36.0)
MCV: 84.8 fL (ref 78.0–100.0)
Platelets: 267 10*3/uL (ref 150–400)
RBC: 3.96 MIL/uL (ref 3.87–5.11)
RDW: 14.7 % (ref 11.5–15.5)
WBC: 6.2 10*3/uL (ref 4.0–10.5)

## 2016-11-27 SURGERY — DILATION AND EVACUATION, UTERUS
Anesthesia: Monitor Anesthesia Care | Site: Vagina

## 2016-11-27 MED ORDER — KETOROLAC TROMETHAMINE 30 MG/ML IJ SOLN
INTRAMUSCULAR | Status: DC | PRN
  Administered 2016-11-27: 30 mg via INTRAVENOUS

## 2016-11-27 MED ORDER — CEFAZOLIN SODIUM-DEXTROSE 2-4 GM/100ML-% IV SOLN
2.0000 g | INTRAVENOUS | Status: AC
  Administered 2016-11-27: 2 g via INTRAVENOUS

## 2016-11-27 MED ORDER — MIDAZOLAM HCL 2 MG/2ML IJ SOLN
INTRAMUSCULAR | Status: AC
  Filled 2016-11-27: qty 2

## 2016-11-27 MED ORDER — ONDANSETRON HCL 4 MG/2ML IJ SOLN
INTRAMUSCULAR | Status: AC
  Filled 2016-11-27: qty 2

## 2016-11-27 MED ORDER — FENTANYL CITRATE (PF) 100 MCG/2ML IJ SOLN
INTRAMUSCULAR | Status: AC
  Filled 2016-11-27: qty 2

## 2016-11-27 MED ORDER — CHLOROPROCAINE HCL 1 % IJ SOLN
INTRAMUSCULAR | Status: AC
  Filled 2016-11-27: qty 30

## 2016-11-27 MED ORDER — DEXAMETHASONE SODIUM PHOSPHATE 4 MG/ML IJ SOLN
INTRAMUSCULAR | Status: AC
  Filled 2016-11-27: qty 1

## 2016-11-27 MED ORDER — ACETAMINOPHEN 160 MG/5ML PO SOLN
975.0000 mg | Freq: Once | ORAL | Status: AC
  Administered 2016-11-27: 975 mg via ORAL

## 2016-11-27 MED ORDER — DEXAMETHASONE SODIUM PHOSPHATE 4 MG/ML IJ SOLN
INTRAMUSCULAR | Status: DC | PRN
  Administered 2016-11-27: 4 mg via INTRAVENOUS

## 2016-11-27 MED ORDER — PROPOFOL 10 MG/ML IV BOLUS
INTRAVENOUS | Status: AC
  Filled 2016-11-27: qty 20

## 2016-11-27 MED ORDER — SCOPOLAMINE 1 MG/3DAYS TD PT72
MEDICATED_PATCH | TRANSDERMAL | Status: AC
  Administered 2016-11-27: 1.5 mg via TRANSDERMAL
  Filled 2016-11-27: qty 1

## 2016-11-27 MED ORDER — BUPIVACAINE HCL (PF) 0.25 % IJ SOLN
INTRAMUSCULAR | Status: DC | PRN
  Administered 2016-11-27: 20 mL

## 2016-11-27 MED ORDER — PROPOFOL 500 MG/50ML IV EMUL
INTRAVENOUS | Status: DC | PRN
  Administered 2016-11-27: 125 ug/kg/min via INTRAVENOUS

## 2016-11-27 MED ORDER — ARTIFICIAL TEARS OP OINT
TOPICAL_OINTMENT | OPHTHALMIC | Status: AC
  Filled 2016-11-27: qty 14

## 2016-11-27 MED ORDER — LIDOCAINE HCL (CARDIAC) 20 MG/ML IV SOLN
INTRAVENOUS | Status: AC
Start: 2016-11-27 — End: 2016-11-27
  Filled 2016-11-27: qty 5

## 2016-11-27 MED ORDER — TRAMADOL HCL 50 MG PO TABS: 50.0000 mg | ORAL_TABLET | Freq: Four times a day (QID) | ORAL | 0 refills | Status: DC | PRN

## 2016-11-27 MED ORDER — FENTANYL CITRATE (PF) 100 MCG/2ML IJ SOLN
INTRAMUSCULAR | Status: DC | PRN
  Administered 2016-11-27: 100 ug via INTRAVENOUS

## 2016-11-27 MED ORDER — ONDANSETRON HCL 4 MG/2ML IJ SOLN
INTRAMUSCULAR | Status: DC | PRN
  Administered 2016-11-27: 4 mg via INTRAVENOUS

## 2016-11-27 MED ORDER — LACTATED RINGERS IV SOLN
INTRAVENOUS | Status: DC
  Administered 2016-11-27: 09:00:00 via INTRAVENOUS

## 2016-11-27 MED ORDER — SCOPOLAMINE 1 MG/3DAYS TD PT72
1.0000 | MEDICATED_PATCH | Freq: Once | TRANSDERMAL | Status: DC
  Administered 2016-11-27: 1.5 mg via TRANSDERMAL

## 2016-11-27 MED ORDER — LIDOCAINE HCL (CARDIAC) 20 MG/ML IV SOLN
INTRAVENOUS | Status: DC | PRN
  Administered 2016-11-27: 40 mg via INTRATRACHEAL

## 2016-11-27 MED ORDER — KETOROLAC TROMETHAMINE 30 MG/ML IJ SOLN
INTRAMUSCULAR | Status: AC
  Filled 2016-11-27: qty 1

## 2016-11-27 MED ORDER — MIDAZOLAM HCL 5 MG/5ML IJ SOLN
INTRAMUSCULAR | Status: DC | PRN
  Administered 2016-11-27: 2 mg via INTRAVENOUS

## 2016-11-27 MED ORDER — FENTANYL CITRATE (PF) 100 MCG/2ML IJ SOLN: 25.0000 ug | INTRAMUSCULAR | Status: DC | PRN

## 2016-11-27 MED ORDER — ACETAMINOPHEN 160 MG/5ML PO SOLN
ORAL | Status: AC
  Administered 2016-11-27: 975 mg via ORAL
  Filled 2016-11-27: qty 40.6

## 2016-11-27 MED ORDER — BUPIVACAINE HCL (PF) 0.25 % IJ SOLN
INTRAMUSCULAR | Status: AC
  Filled 2016-11-27: qty 30

## 2016-11-27 SURGICAL SUPPLY — 18 items
CATH ROBINSON RED A/P 16FR (CATHETERS) ×3 IMPLANT
CLOTH BEACON ORANGE TIMEOUT ST (SAFETY) ×3 IMPLANT
DECANTER SPIKE VIAL GLASS SM (MISCELLANEOUS) ×3 IMPLANT
GLOVE BIO SURGEON STRL SZ7.5 (GLOVE) ×3 IMPLANT
GLOVE BIOGEL PI IND STRL 7.0 (GLOVE) ×1 IMPLANT
GLOVE BIOGEL PI INDICATOR 7.0 (GLOVE) ×2
GOWN STRL REUS W/TWL LRG LVL3 (GOWN DISPOSABLE) ×6 IMPLANT
KIT BERKELEY 1ST TRIMESTER 3/8 (MISCELLANEOUS) ×3 IMPLANT
NS IRRIG 1000ML POUR BTL (IV SOLUTION) ×3 IMPLANT
PACK VAGINAL MINOR WOMEN LF (CUSTOM PROCEDURE TRAY) ×3 IMPLANT
PAD OB MATERNITY 4.3X12.25 (PERSONAL CARE ITEMS) ×3 IMPLANT
PAD PREP 24X48 CUFFED NSTRL (MISCELLANEOUS) ×3 IMPLANT
SET BERKELEY SUCTION TUBING (SUCTIONS) ×3 IMPLANT
TOWEL OR 17X24 6PK STRL BLUE (TOWEL DISPOSABLE) ×6 IMPLANT
VACURETTE 10 RIGID CVD (CANNULA) ×3 IMPLANT
VACURETTE 7MM CVD STRL WRAP (CANNULA) IMPLANT
VACURETTE 8 RIGID CVD (CANNULA) IMPLANT
VACURETTE 9 RIGID CVD (CANNULA) IMPLANT

## 2016-11-27 NOTE — Op Note (Signed)
11/27/2016  10:21 AM  PATIENT:  Chelsea Allen  36 y.o. female  PRE-OPERATIVE DIAGNOSIS:  Missed Abortion- 13 weeks Repetitive pregnancy loss  POST-OPERATIVE DIAGNOSIS:  missed abortion  PROCEDURE:  Procedure(s): DILATATION AND EVACUATION with Ultrasound Guidance and Chromosome Studies  SURGEON:  Surgeon(s): Olivia Mackieichard Aayana Reinertsen, MD  ASSISTANTS: none   ANESTHESIA:   local and IV sedation  ESTIMATED BLOOD LOSS: 50cc  DRAINS: none   LOCAL MEDICATIONS USED:  MARCAINE    and Amount: 20 ml  SPECIMEN:  Source of Specimen:  POC  DISPOSITION OF SPECIMEN:  PATHOLOGY  COUNTS:  YES  DICTATION #: [id number]  PLAN OF CARE: dc home  PATIENT DISPOSITION:  PACU - hemodynamically stable.

## 2016-11-27 NOTE — Discharge Instructions (Addendum)
DISCHARGE INSTRUCTIONS: D&C / D&E The following instructions have been prepared to help you care for yourself upon your return home.   Personal hygiene:  Use sanitary pads for vaginal drainage, not tampons.  Shower the day after your procedure.  NO tub baths, pools or Jacuzzis for 2-3 weeks.  Wipe front to back after using the bathroom.  Activity and limitations:  Do NOT drive or operate any equipment for 24 hours. The effects of anesthesia are still present and drowsiness may result.  Do NOT rest in bed all day.  Walking is encouraged.  Walk up and down stairs slowly.  You may resume your normal activity in one to two days or as indicated by your physician.  Sexual activity: NO intercourse for at least 2 weeks after the procedure, or as indicated by your physician.  Diet: Eat a light meal as desired this evening. You may resume your usual diet tomorrow.  Return to work: You may resume your work activities in one to two days or as indicated by your doctor.  What to expect after your surgery: Expect to have vaginal bleeding/discharge for 2-3 days and spotting for up to 10 days. It is not unusual to have soreness for up to 1-2 weeks. You may have a slight burning sensation when you urinate for the first day. Mild cramps may continue for a couple of days. You may have a regular period in 2-6 weeks.  Call your doctor for any of the following:  Excessive vaginal bleeding, saturating and changing one pad every hour.  Inability to urinate 6 hours after discharge from hospital.  Pain not relieved by pain medication.  Fever of 100.4 F or greater.  Unusual vaginal discharge or odor.   Call for an appointment:    Patients signature: ______________________  Nurses signature ________________________  Support person's signature_______________________  Post Anesthesia Home Care Instructions  NO IBUPROFEN PRODUCTS UNTIL: 3:30 PM TODAY Activity: Get plenty of rest for the  remainder of the day. A responsible adult should stay with you for 24 hours following the procedure.  For the next 24 hours, DO NOT: -Drive a car -Advertising copywriterperate machinery -Drink alcoholic beverages -Take any medication unless instructed by your physician -Make any legal decisions or sign important papers.  Meals: Start with liquid foods such as gelatin or soup. Progress to regular foods as tolerated. Avoid greasy, spicy, heavy foods. If nausea and/or vomiting occur, drink only clear liquids until the nausea and/or vomiting subsides. Call your physician if vomiting continues.  Special Instructions/Symptoms: Your throat may feel dry or sore from the anesthesia or the breathing tube placed in your throat during surgery. If this causes discomfort, gargle with warm salt water. The discomfort should disappear within 24 hours.  If you had a scopolamine patch placed behind your ear for the management of post- operative nausea and/or vomiting:  1. The medication in the patch is effective for 72 hours, after which it should be removed.  Wrap patch in a tissue and discard in the trash. Wash hands thoroughly with soap and water. 2. You may remove the patch earlier than 72 hours if you experience unpleasant side effects which may include dry mouth, dizziness or visual disturbances. 3. Avoid touching the patch. Wash your hands with soap and water after contact with the patch.

## 2016-11-27 NOTE — Anesthesia Postprocedure Evaluation (Signed)
Anesthesia Post Note  Patient: Chelsea Allen  Procedure(s) Performed: Procedure(s) (LRB): DILATATION AND EVACUATION with Ultrasound Guidance and Chromosome Studies (N/A)  Patient location during evaluation: PACU Anesthesia Type: MAC Level of consciousness: awake and alert Pain management: pain level controlled Vital Signs Assessment: post-procedure vital signs reviewed and stable Respiratory status: spontaneous breathing, nonlabored ventilation, respiratory function stable and patient connected to nasal cannula oxygen Cardiovascular status: stable and blood pressure returned to baseline Anesthetic complications: no        Last Vitals:  Vitals:   11/27/16 0949 11/27/16 1000  BP: (!) 96/53 92/68  Pulse: 83 68  Resp: 16 13  Temp: 36.9 C     Last Pain:  Vitals:   11/27/16 1000  TempSrc:   PainSc: 1    Pain Goal: Patients Stated Pain Goal: 2 (11/27/16 1000)               Curtina Grills EDWARD

## 2016-11-27 NOTE — Progress Notes (Signed)
Patient seen and examined. Consent witnessed and signed. No changes noted. Update completed.Patient ID: Chelsea DameMeredith T Allen, female   DOB: 1981/05/27, 36 y.o.   MRN: [id number]

## 2016-11-27 NOTE — H&P (Signed)
Chelsea Allen is an 36 y.o. female. MAB at 13 weeks.  Pertinent Gynecological History: Menses: flow is moderate Bleeding: na Contraception: none DES exposure: denies Blood transfusions: none Sexually transmitted diseases: no past history Previous GYN Procedures: DNC  Last mammogram: na Date: na Last pap: normal Date: 2017 OB History: G4, P2   Menstrual History: Menarche age: [age] Patient's last menstrual period was 08/26/2016 (approximate).    Past Medical History:  Diagnosis Date  . Anemia   . Anorexia nervosa with bulimia in college   none in 10+ years  . Depression   . H/O varicella   . Headache    Migraines  . Maternal anemia, with delivery 07/22/2012  . Mitral valve prolapse 2004  . Other disorder of muscle, ligament, and fascia    IITS-left knee  . Personal history of other mental disorder    anorexia nervosa  . PONV (postoperative nausea and vomiting)   . Urinary tract infection, site not specified     Past Surgical History:  Procedure Laterality Date  . WISDOM TOOTH EXTRACTION      Family History  Problem Relation Age of Onset  . Depression Mother   . Depression Sister   . Depression Maternal Grandfather   . Cancer Paternal Grandfather     lung    Social History:  reports that she has never smoked. She has never used smokeless tobacco. She reports that she drinks alcohol. She reports that she does not use drugs.  Allergies:  Allergies  Allergen Reactions  . Beef-Derived Products Hives  . Lambs X[employer] EnergyQuarters Hives  . Pork-Derived Conservation officer, natureroducts Hives  . Sulfonamide Derivatives Hives    Prescriptions Prior to Admission  Medication Sig Dispense Refill Last Dose  . acetaminophen (TYLENOL) 325 MG tablet Take 650 mg by mouth every 6 (six) hours as needed for mild pain or headache.   11/24/2016 at Unknown time  . calcium carbonate (TUMS - DOSED IN MG ELEMENTAL CALCIUM) 500 MG chewable tablet Chew 2 tablets by mouth daily.   Past Week at Unknown time  .  Prenatal Vit-Fe Fumarate-FA (PRENATAL MULTIVITAMIN) TABS Take 1 tablet by mouth daily.   Past Week at Unknown time    Review of Systems  Constitutional: Negative.   All other systems reviewed and are negative.   Blood pressure 104/63, pulse 73, temperature 98 F (36.7 C), temperature source Oral, resp. rate 18, height 5\' 4"  (1.626 m), weight 56.7 kg (125 lb), last menstrual period 08/26/2016, SpO2 100 %, unknown if currently breastfeeding. Physical Exam  No results found for this or any previous visit (from the past 24 hour(s)).  No results found.  Assessment/Plan: MAB at13 weeks D&E Consent done. Tissue for chromosomes due to history of late first trimester loss x 2. No genetic screening done.  Chelsea Allen J 11/27/2016, 8:42 AM

## 2016-11-27 NOTE — Transfer of Care (Signed)
Immediate Anesthesia Transfer of Care Note  Patient: Chelsea Allen  Procedure(s) Performed: Procedure(s): DILATATION AND EVACUATION with Ultrasound Guidance and Chromosome Studies (N/A)  Patient Location: PACU  Anesthesia Type:MAC  Level of Consciousness: awake, alert  and oriented  Airway & Oxygen Therapy: Patient Spontanous Breathing and Patient connected to nasal cannula oxygen  Post-op Assessment: Report given to RN and Post -op Vital signs reviewed and stable  Post vital signs: Reviewed and stable  Last Vitals:  Vitals:   11/27/16 0828  BP: 104/63  Pulse: 73  Resp: 18  Temp: 36.7 C    Last Pain:  Vitals:   11/27/16 0828  TempSrc: Oral  PainSc: 0-No pain      Patients Stated Pain Goal: 2 (43/14/27 6701)  Complications: No apparent anesthesia complications

## 2016-11-27 NOTE — Anesthesia Preprocedure Evaluation (Addendum)

## 2016-11-28 ENCOUNTER — Encounter (HOSPITAL_COMMUNITY): Payer: Self-pay | Admitting: Obstetrics and Gynecology

## 2016-11-28 NOTE — Op Note (Signed)
NAMC[facility] Skains:  Allen, [name]            ACCOUNT NO.:  [id number]  MEDICAL RECORD NO.:  [id number]  LOCATION:                                 FACILITY:  PHYSICIAN:  Lenoard Adenichard J. Jerrad [name], M.D.     DATE OF BIRTH:  DATE OF PROCEDURE: DATE OF DISCHARGE:                              OPERATIVE REPORT   PREOPERATIVE DIAGNOSES: 1. A 13-week missed abortion. 2. Repetitive pregnancy loss.  POSTOPERATIVE DIAGNOSES: 1. A 13-week missed abortion. 2. Repetitive pregnancy loss.  PROCEDURE:  Suction, dilatation, and evacuation with ultrasound guidance and tissue sent for chromosome studies.  SURGEON:  Lenoard Adenichard J. [name], M.D.  ASSISTANT:  None.  ANESTHESIA:  Local IV sedation.  ESTIMATED BLOOD LOSS:  50 mL.  DRAINS:  None.  COUNTS:  Correct.  DISPOSITION:  The patient to Recovery in good condition.  SPECIMENS:  Products of conception to Pathology and separately collected for chromosome studies.  BRIEF OPERATIVE NOTE:  After being apprised of risks of anesthesia; infection; bleeding; injury to surrounding organs; possible need for repair; delayed versus immediate complications to include bowel, bladder injury, and possible need for repair, the patient was brought to the operating room where she was administered IV sedation without difficulty.  Prepped and draped in usual sterile fashion.  Feet were placed in the Yellofin stirrups.  Bladder catheterized until empty. Exam under anesthesia reveals an acutely anteflexed 12- to 13-week size uterus.  No adnexal masses appreciated.  Dilute paracervical block using 0.25% Marcaine placed, 20 mL total.  Cervix easily dilated up to #30 Pratt dilator.  A 10 mm suction curette placed under ultrasound guidance.  Aspiration of products of conception was done without difficulty.  Repeat suction and blunt curettage in a 4-quadrant method under ultrasound guidance revealed the cavity to be empty.  Good hemostasis noted.  All instruments were removed.   Tissues inspected, sent for chromosome studies.  The patient tolerated the procedure well, was awakened and transferred to recovery room in good condition.     Lenoard Adenichard J. [name], M.D.     RJT/MEDQ  D:  11/27/2016  T:  11/28/2016  Job:  [id number]  cc:   __________

## 2016-12-05 LAB — TISSUE HYBRIDIZATION TO NCBH

## 2016-12-17 LAB — CHROMOSOME STD, POC(TISSUE)-NCBH

## 2017-02-04 ENCOUNTER — Encounter: Payer: Self-pay | Admitting: Hematology

## 2017-02-04 ENCOUNTER — Telehealth: Payer: Self-pay | Admitting: Hematology

## 2017-02-04 NOTE — Telephone Encounter (Signed)
Appt has been scheduled fort he pt to see Dr. Candise Che on 5/9 at 11am. Demographics verified. Pt aware to arrive 30 minutes early. Letter and directions mailed.

## 2017-03-13 ENCOUNTER — Ambulatory Visit: Payer: BC Managed Care – PPO

## 2017-03-13 ENCOUNTER — Ambulatory Visit (HOSPITAL_BASED_OUTPATIENT_CLINIC_OR_DEPARTMENT_OTHER): Payer: BC Managed Care – PPO | Admitting: Hematology

## 2017-03-13 ENCOUNTER — Encounter: Payer: Self-pay | Admitting: Hematology

## 2017-03-13 ENCOUNTER — Other Ambulatory Visit (HOSPITAL_COMMUNITY)
Admission: RE | Admit: 2017-03-13 | Discharge: 2017-03-13 | Disposition: A | Discharge status: Home / Self Care | Payer: BC Managed Care – PPO | Source: Ambulatory Visit | Attending: Hematology | Admitting: Hematology

## 2017-03-13 VITALS — BP 117/80 | HR 65 | Temp 98.6°F | Ht 64.0 in | Wt 114.6 lb

## 2017-03-13 DIAGNOSIS — Z8759 Personal history of other complications of pregnancy, childbirth and the puerperium: Secondary | ICD-10-CM

## 2017-03-13 DIAGNOSIS — D6859 Other primary thrombophilia: Secondary | ICD-10-CM | POA: Diagnosis not present

## 2017-03-13 DIAGNOSIS — N96 Recurrent pregnancy loss: Secondary | ICD-10-CM

## 2017-03-13 DIAGNOSIS — E7210 Disorders of sulfur-bearing amino-acid metabolism, unspecified: Secondary | ICD-10-CM

## 2017-03-13 DIAGNOSIS — N926 Irregular menstruation, unspecified: Secondary | ICD-10-CM

## 2017-03-13 LAB — PREGNANCY, URINE: Preg Test, Ur: POSITIVE — AB

## 2017-03-13 NOTE — Patient Instructions (Signed)
Thank you for choosing [facility] to provide your oncology and hematology care.  To afford each patient quality time with our providers, please arrive 30 minutes before your scheduled appointment time.  If you arrive late for your appointment, you may be asked to reschedule.  We strive to give you quality time with our providers, and arriving late affects you and other patients whose appointments are after yours.  If you are a no show for multiple scheduled visits, you may be dismissed from the clinic at the providers discretion.   Again, thank you for choosing [facility], our hope is that these requests will decrease the amount of time that you wait before being seen by our physicians.  ______________________________________________________________________ Should you have questions after your visit to the [facility], please contact our office at [phone] between the hours of 8:30 and 4:30 p.m.    Voicemails left after 4:30p.m will not be returned until the following business day.   For prescription refill requests, please have your pharmacy contact us directly.  Please also try to allow 48 hours for prescription requests.   Please contact the scheduling department for questions regarding scheduling.  For scheduling of procedures such as PET scans, CT scans, MRI, Ultrasound, etc please contact central scheduling at [phone].   Resources For Cancer Patients and Caregivers:  American Cancer Society:  800-227-2345  Can help patients locate various types of support and financial assistance Cancer Care: 1-800-813-HOPE (4673) Provides financial assistance, online support groups, medication/co-pay assistance.   [place] DSS:  336-641-3447 Where to apply for food stamps, Medicaid, and utility assistance Medicare Rights Center: 800-333-4114 Helps people with Medicare understand their rights and benefits, navigate the Medicare system, and secure the  quality healthcare they deserve SCAT: 336-333-6589 [employer] [employer]'s shared-ride transportation service for eligible riders who have a disability that prevents them from riding the fixed route bus.   For additional information on assistance programs please contact our social worker:   Grier Hock/Abigail Elmore:  [phone] 

## 2017-03-19 NOTE — Progress Notes (Signed)
[facility]    HEMATOLOGY/ONCOLOGY CONSULTATION NOTE  Date of Service: .03/13/2017  Patient Care Team: Hannah Beat, MD as PCP - General Olivia Mackie, MD as Consulting Physician (Obstetrics and Gynecology)  CHIEF COMPLAINTS/PURPOSE OF CONSULTATION:   -heterozygous prothrombin gene mutation -heterozygous MTHFR gene mutation -Miscarriage - unclear etiology ?related to thrombophilia.  HISTORY OF PRESENTING ILLNESS:   Chelsea Allen is a wonderful 36 y.o. female who has been referred to [place] by Dr Olivia Mackie MD/Dr Karleen Hampshire Copland for evaluation and recommendation regarding her thrombophilia and it's role in her recent miscarriage and recommendations regarding anticoagulation.  Patient reports no personal history of venous thromboembolism.  She had her first pregnancy in 2011 which resulted in spontaneous abortion/miscarriage at 11-12 weeks. The fetus was lost and could not be evaluated. Unclear etiology.  Her second pregnancy was from December 2012 to September 2013 and resulted in a full-term vaginal delivery at 41.5 weeks.  Her third pregnancy resulted in a normal full term vaginal delivery in December 2016 at about 41 weeks.  Her fourth pregnancy related to a spontaneous abortion at about 12 weeks. Products of conception did not show any overt chromosomal abnormalities. She notes that she was under significant stress and wasn't eating and sleeping well since she was running actively for office.   She had a thrombophilia workup by her GYN doctor which showed negative beta 2 glycoprotein 1 antibodies, negative anticardiolipin antibodies, negative lupus anticoagulant, negative factor V Leiden mutation, normal antithrombin III, normal protein C antigen normal protein S.  The thrombophilia workup was significant for heterozygous prothrombin gene mutation and heterozygous MTHFR gene mutation (unclear significance).  She was referred to [place] regarding recommendations for management of  anticoagulation in the setting.  Patient has had no prior personal history of venous thromboembolism. She does not reveal any known family history of venous thromboembolism.  She has previously been on oral contraceptive birth control pills for a few years in 2003 but no blood control pills for the last 10 years.  She has been started on low-dose aspirin by her GYN doctor.  She has missed her period for 2 weeks and urine pregnancy test today is positive.   MEDICAL HISTORY:  Past Medical History:  Diagnosis Date  . Anemia   . Anorexia nervosa with bulimia in college   none in 10+ years  . Depression   . H/O varicella   . Headache    Migraines  . Maternal anemia, with delivery 07/22/2012  . Mitral valve prolapse 2004  . Other disorder of muscle, ligament, and fascia    IITS-left knee  . Personal history of other mental disorder    anorexia nervosa  . PONV (postoperative nausea and vomiting)   . Urinary tract infection, site not specified     SURGICAL HISTORY: Past Surgical History:  Procedure Laterality Date  . DILATION AND EVACUATION N/A 11/27/2016   Procedure: DILATATION AND EVACUATION with Ultrasound Guidance and Chromosome Studies;  Surgeon: Olivia Mackie, MD;  Location: [facility];  Service: Gynecology;  Laterality: N/A;  . WISDOM TOOTH EXTRACTION      SOCIAL HISTORY: Social History   Social History  . Marital status: Married    Spouse name: N/A  . Number of children: N/A  . Years of education: N/A   Occupational History  . 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 Smoking status: Never Smoker  . Smokeless tobacco: Never Used  . Alcohol use Yes     Comment:  occ none since pregnancy  . Drug use: No  . Sexual activity: Yes    Birth control/ protection: None   Other Topics Concern  . 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 Depression Mother   . Depression Sister   . Depression Maternal Grandfather   . Cancer Paternal Grandfather        lung    ALLERGIES:  is allergic to beef-derived products; lambs quarters; pork-derived products; and sulfonamide derivatives.  MEDICATIONS:  Current Outpatient Prescriptions  Medication Sig Dispense Refill  . Multiple Vitamins-Minerals (MULTIVITAMIN ADULT PO) Take by mouth.     No current facility-administered medications for this visit.     REVIEW OF SYSTEMS:    10 Point review of Systems was done is negative except as noted above.  PHYSICAL EXAMINATION: ECOG PERFORMANCE STATUS: 0 - Asymptomatic  . Vitals:   03/13/17 1111  BP: 117/80  Pulse: 65  Temp: 98.6 F (37 C)   Filed Weights   03/13/17 1111  Weight: 114 lb 9.6 oz (52 kg)   .Body mass index is 19.67 kg/m.  GENERAL:alert, in no acute distress and comfortable SKIN: no acute rashes, no significant lesions EYES: conjunctiva are pink and non-injected, sclera anicteric OROPHARYNX: MMM, no exudates, no oropharyngeal erythema or ulceration NECK: supple, no JVD LYMPH:  no palpable lymphadenopathy in the cervical, axillary or inguinal regions LUNGS: clear to auscultation b/l with normal respiratory effort HEART: regular rate & rhythm ABDOMEN:  normoactive bowel sounds , non tender, not distended. Extremity: no pedal edema PSYCH: alert & oriented x 3 with fluent speech NEURO: no focal motor/sensory deficits  LABORATORY DATA:  I have reviewed the data as listed  . CBC Latest Ref Rng & Units 11/27/2016 03/08/2015 03/07/2015  WBC 4.0 - 10.5 K/uL 6.2 13.9(H) 9.6  Hemoglobin 12.0 - 15.0 g/dL 11.6(L) 8.1(L) 10.4(L)  Hematocrit 36.0 - 46.0 % 33.6(L) 24.8(L) 31.8(L)  Platelets 150 - 400 K/uL 267 147(L) 168    . CMP Latest Ref Rng & Units 03/18/2012  Potassium 3.5 - 5.1 mEq/L 4.0     RADIOGRAPHIC STUDIES: I have personally reviewed the radiological images as listed and agreed with the findings  in the report. No results found.  ASSESSMENT & PLAN:    36 year old Caucasian female with  #1 heterozygous prothrombin gene mutation - no personal or family history of venous thromboembolism. #2 heterozygous MTHFR gene mutation of unknown significance. #3 history of 2 miscarriages in late first/early second trimester. One in 2011 at 11-12 weeks with unclear etiology. The last one was in January 2018 at 12 weeks. Parts of conception were chromosomally normal. Patient has had 2 full term normal vaginal deliveries. #4 patient is currently pregnant based on her urine pregnancy test today. Plan -We discussed that thrombophilia is causing miscarriages tend to be usually associated with late second and third trimester pregnancy losses with a greater correlation. 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 -If there is uncertainty and no other overt findings we sometimes empirically recommend the use of prophylactic Lovenox throughout pregnancy and for 6 weeks postpartum to reduce the risk of placental thrombosis as well as personal venous thromboembolism. -Patient notes that she is opposed to the idea of using Lovenox throughout pregnancy  but would consider low-dose aspirin as recommended by her GYN doctor. -We discussed the pros and cons of low-dose aspirin. She notes that she will discuss this further with her GYN doctor Dr. Billy Coast. -We discussed that from a venous thromboembolism prophylaxis standpoint since she has no personal history of venous thromboembolism or family history of venous thromboembolism and has a mild thrombophilia we would recommend prophylactic Lovenox 60 mg subcutaneous daily for at least 6 weeks postpartum. -She was recommended follow-up with her GYN doctor to inform them off her positive urine pregnancy test to setup a high-risk pregnancy monitoring as needed. 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  Patient and her husband were appreciative of the discussion. They had multiple questions which were answered in details.  All of the patients questions were answered with apparent satisfaction. The patient knows to call the clinic with any problems, questions or concerns.  I spent 45 minutes counseling the patient face to face. The total time spent in the appointment was 60 minutes and more than 50% was on counseling and direct patient cares.    Wyvonnia Lora MD MS AAHIVMS [facility] Hematology/Oncology Physician [facility]  (Office):       [phone] (Work cell):  [phone] (Fax):           [phone]

## 2017-09-12 ENCOUNTER — Telehealth: Payer: Self-pay | Admitting: Hematology

## 2017-09-12 NOTE — Telephone Encounter (Signed)
Spoke with patient regarding appts that were added per 11/7 sch msg.

## 2017-09-18 ENCOUNTER — Telehealth: Payer: Self-pay | Admitting: Hematology

## 2017-09-18 ENCOUNTER — Ambulatory Visit: Payer: BC Managed Care – PPO | Admitting: Hematology

## 2017-09-18 NOTE — Telephone Encounter (Signed)
Received a call from [facility]rea at [facility] to cancel the pt's appt with Dr. Candise CheKale for 11/14. Pt is seeing Dr. Cyndie ChimeGranfortuna.

## 2017-09-20 ENCOUNTER — Encounter (INDEPENDENT_AMBULATORY_CARE_PROVIDER_SITE_OTHER): Payer: Self-pay

## 2017-09-20 ENCOUNTER — Encounter: Payer: Self-pay | Admitting: Oncology

## 2017-09-20 ENCOUNTER — Ambulatory Visit: Payer: BC Managed Care – PPO | Admitting: Oncology

## 2017-09-20 ENCOUNTER — Other Ambulatory Visit: Payer: Self-pay

## 2017-09-20 DIAGNOSIS — D6852 Prothrombin gene mutation: Secondary | ICD-10-CM

## 2017-09-20 DIAGNOSIS — Z882 Allergy status to sulfonamides status: Secondary | ICD-10-CM | POA: Diagnosis not present

## 2017-09-20 DIAGNOSIS — E7212 Methylenetetrahydrofolate reductase deficiency: Secondary | ICD-10-CM | POA: Diagnosis not present

## 2017-09-20 DIAGNOSIS — Z91018 Allergy to other foods: Secondary | ICD-10-CM

## 2017-09-20 DIAGNOSIS — Z3A08 8 weeks gestation of pregnancy: Secondary | ICD-10-CM

## 2017-09-20 DIAGNOSIS — O30001 Twin pregnancy, unspecified number of placenta and unspecified number of amniotic sacs, first trimester: Secondary | ICD-10-CM | POA: Diagnosis not present

## 2017-09-20 DIAGNOSIS — O99281 Endocrine, nutritional and metabolic diseases complicating pregnancy, first trimester: Secondary | ICD-10-CM | POA: Diagnosis not present

## 2017-09-20 DIAGNOSIS — O2621 Pregnancy care for patient with recurrent pregnancy loss, first trimester: Secondary | ICD-10-CM | POA: Diagnosis not present

## 2017-09-20 DIAGNOSIS — O99111 Other diseases of the blood and blood-forming organs and certain disorders involving the immune mechanism complicating pregnancy, first trimester: Secondary | ICD-10-CM

## 2017-09-20 HISTORY — DX: Prothrombin gene mutation: D68.52

## 2017-09-20 NOTE — Patient Instructions (Signed)
No need to use a blood thinner at present I would recommend Arixtra 2.5 mg subcutaneous injection daily for 6 weeks post partum if you require a C-section  Return visit with Hematologist as needed

## 2017-09-20 NOTE — Progress Notes (Signed)
See full consult note. Dictated in another Epic window.

## 2017-09-20 NOTE — Consult Note (Signed)
New Patient Hematology   Chelsea Allen [id number] 02/24/1981 36 y.o. 09/20/2017  CC: Dr. 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  She has been pregnant 6 times.  First pregnancy 2011 ended with a miscarriage at 11 weeks.  Second pregnancy in 2012 and third pregnancy 2015 both carried to term at 42 weeks.  2 healthy boys now aged 5 and 2.  Fourth pregnancy October 2017 and with a miscarriage at 12 weeks fifth pregnancy in April 2018 ended with a miscarriage at 13 weeks.  She is currently pregnant for the sixth time became pregnant in November 2018.  This is a twin pregnancy.  She is due in June 2019.  In view of the third total miscarriage, a hypercoagulable panel was done in February 2018 remarkable only for heterozygote status for the prothrombin gene mutation and heterozygous for the MTHFR mutation.  Negative for factor V Leiden, negative lupus anticoagulant, negative anticardiolipin antibodies, negative antibodies to beta-2 glycoprotein 1, normal Antithrombin level, protein C antigenic normal 81% of control, protein S total 78%, protein S free 72%.  Baseline pro time 10.8 seconds.  PTT 26 seconds. CBC done November 27, 2016 with hemoglobin 11.6, hematocrit 34, MCV 85, white count 6200, and platelets 267,000.  She has no signs or symptoms of a collagen vascular disease.  Thyroid functions are normal.  There is no personal or family history of thrombosis.  Parents are alive and well mother age 37, father age 37.  Brother age 37.  Sister age 10929 who has had a successful pregnancy.  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  She has had chronic problems with intermittent facial  swelling, nausea and vomiting, and urticaria which were idiopathic until recently when they were felt to be related to the alpha gal. This may be an issue with respect to heparin products which are typically made from pork or beef lung.   PMH: Past Medical History:  Diagnosis Date  . Anemia   . Anorexia nervosa with bulimia in college   none in 10+ years  . Depression   . H/O varicella   . Headache    Migraines  . Maternal anemia, with delivery 07/22/2012  . Mitral valve prolapse 2004  . Other disorder of muscle, ligament, and fascia    IITS-left knee  . Personal history of other mental disorder    anorexia nervosa  . PONV (postoperative nausea and vomiting)   . 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 Urinary tract infection, site not specified   No history of hypertension, diabetes, thyroid disease, ulcers, hepatitis, yellow jaundice, malaria, mononucleosis, inflammatory arthritis, seizure, or stroke.  Past Surgical History:  Procedure Laterality Date  . DILATATION AND EVACUATION with Ultrasound Guidance and Chromosome Studies N/A 11/27/2016   Per[facility] by Olivia [facility]avon, Richard[facility]  . WISDOM TOOTH EXTRACTION      Allergies: Allergies  Allergen Reactions  . Beef-Derived Products Hives  . Lambs Xcel EnergyQuarters Hives  . Pork-Derived Conservation officer, natureroducts Hives  . Sulfonamide Derivatives Hives    Medications:  Current Outpatient Medications:  Marland Kitchen.  Multiple Vitamins-Minerals (MULTIVITAMIN ADULT PO), Take by mouth.,  Disp: , Rfl:   Social History: Married.  2 boys age 47 and 5 who are healthy.  Husband accompanies her today.  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 She is a never smoker.  Occasional glass of wine when she is not pregnant.    Family History: Family History  Problem Relation Age of Onset  . Depression Mother   . Depression Sister   . Depression Maternal Grandfather   . Cancer Paternal  Grandfather        lung    Review of Systems: No headache or change in vision.  No constitutional symptoms.  No dysphagia.  No dyspnea, chest pain, or palpitations.  No abdominal pain.  No change in bowel habit.  Menses were regular before pregnancy.  No polymyalgia or polyarthralgia.  No unusual rashes if she maintains her organic diet. Remaining ROS negative.  Physical Exam: Blood pressure 124/71, pulse 78, temperature 98.2 F (36.8 C), temperature source Oral, height 5\' 4"  (1.626 m), weight 124 lb 3.2 oz (56.3 kg), SpO2 100 %, unknown if currently breastfeeding. Wt Readings from Last 3 Encounters:  09/20/17 124 lb 3.2 oz (56.3 kg)  03/13/17 114 lb 9.6 oz (52 kg)  11/27/16 125 lb (56.7 kg)     General appearance: Well-nourished, slim, Caucasian woman HENNT: Pharynx no erythema, exudate, mass, or ulcer. No thyromegaly or thyroid nodules Lymph nodes: No cervical, supraclavicular, or axillary lymphadenopathy Breasts:  Lungs: Clear to auscultation, resonant to percussion throughout Heart: Regular rhythm, no murmur, no gallop, no rub, no click, no edema Abdomen: Soft, nontender, normal bowel sounds, no mass, no organomegaly Extremities: No edema, no calf tenderness Musculoskeletal: no joint deformities GU:  Vascular: Carotid pulses 2+, distal pulses: Dorsalis pedis nonpalpable symmetrically.  Feet cool but acyanotic. Neurologic: Alert, oriented, PERRLA, optic discs sharp and vessels normal, no hemorrhage or exudate, cranial nerves grossly normal, motor strength 5 over 5, reflexes 1+ symmetric, upper body coordination normal, gait normal, Skin: No rash or ecchymosis    Lab Results: Lab Results  Component Value Date   WBC 6.2 11/27/2016   HGB 11.6 (L) 11/27/2016   HCT 33.6 (L) 11/27/2016   MCV 84.8 11/27/2016   PLT 267 11/27/2016     Chemistry      Component Value Date/Time   K 4.0 03/18/2012 0854   No results found for: CALCIUM, ALKPHOS, AST, ALT, BILITOT      Impression: First trimester pregnancy losses following first, fourth, and fifth pregnancies.  2 intervening pregnancies carried to term.  Currently at week 8, sixth pregnancy, and it appears that this is a twin pregnancy.  She is a heterozygote for the prothrombin gene mutation and the MTHFR gene mutations.  There is no good evidence from prospective trials that the heterozygote status for either one of these genes is associated with pregnancy complications or recurrent pregnancy losses.  She may be at slightly increased risk for thrombosis above and beyond the risk of just being pregnant related to the prothrombin gene mutation.  However, in the absence of any previous thrombotic events, there is no indication for anticoagulation antepartum. Recent review article published as a practice balloon by the [employer] of obstetrics and gynecology volume 132(1):e18 published in July 2018 reinforces the recommendation above.   Recommendation: I would not recommend any antepartum anticoagulation. If she requires a cesarean section, I would anticoagulate for 6 weeks with prophylactic dose fondaparinux (Arixtra) 2.5 mg subcutaneous daily for 6 weeks.  This is  a synthetic Pentasaccharide Xa inhibitors so should not pose any problems with respect to her meat allergies. With respect to her alpha gal allergy syndrome, I gave her a reference for an outstanding physician with a special interest in this field [facility] the faculty [facility]. Dr Harvie Juniornyinye Iweala.    Cephas DarbyJames Granfortuna, MD, FACP  Hematology-Oncology/Internal Medicine  09/20/2017, 4:49 PM

## 2017-09-25 LAB — OB RESULTS CONSOLE ABO/RH: RH Type: POSITIVE

## 2017-09-25 LAB — OB RESULTS CONSOLE RPR: RPR: NONREACTIVE

## 2017-09-25 LAB — OB RESULTS CONSOLE GC/CHLAMYDIA
Chlamydia: NEGATIVE
Gonorrhea: NEGATIVE

## 2017-09-25 LAB — OB RESULTS CONSOLE RUBELLA ANTIBODY, IGM: Rubella: IMMUNE

## 2017-09-25 LAB — OB RESULTS CONSOLE ANTIBODY SCREEN: ANTIBODY SCREEN: NEGATIVE

## 2017-09-25 LAB — OB RESULTS CONSOLE HIV ANTIBODY (ROUTINE TESTING): HIV: NONREACTIVE

## 2017-09-25 LAB — OB RESULTS CONSOLE HEPATITIS B SURFACE ANTIGEN: HEP B S AG: NEGATIVE

## 2018-01-30 ENCOUNTER — Other Ambulatory Visit: Payer: Self-pay | Admitting: Obstetrics and Gynecology

## 2018-02-04 ENCOUNTER — Encounter (HOSPITAL_COMMUNITY): Payer: Self-pay

## 2018-02-04 ENCOUNTER — Inpatient Hospital Stay (HOSPITAL_COMMUNITY)
Admission: AD | Admit: 2018-02-04 | Discharge: 2018-02-04 | Disposition: A | Discharge status: Home / Self Care | Payer: BC Managed Care – PPO | Source: Ambulatory Visit | Attending: Obstetrics and Gynecology | Admitting: Obstetrics and Gynecology

## 2018-02-04 ENCOUNTER — Other Ambulatory Visit: Payer: Self-pay

## 2018-02-04 DIAGNOSIS — N898 Other specified noninflammatory disorders of vagina: Secondary | ICD-10-CM | POA: Diagnosis not present

## 2018-02-04 DIAGNOSIS — O30003 Twin pregnancy, unspecified number of placenta and unspecified number of amniotic sacs, third trimester: Secondary | ICD-10-CM | POA: Diagnosis not present

## 2018-02-04 DIAGNOSIS — O26893 Other specified pregnancy related conditions, third trimester: Secondary | ICD-10-CM | POA: Diagnosis not present

## 2018-02-04 DIAGNOSIS — Z3A28 28 weeks gestation of pregnancy: Secondary | ICD-10-CM | POA: Insufficient documentation

## 2018-02-04 NOTE — Discharge Instructions (Signed)
Second Trimester of Pregnancy The second trimester is from week 13 through week 28, month 4 through 6. This is often the time in pregnancy that you feel your best. Often times, morning sickness has lessened or quit. You may have more energy, and you may get hungry more often. Your unborn baby (fetus) is growing rapidly. At the end of the sixth month, he or she is about 9 inches long and weighs about 1 pounds. You will likely feel the baby move (quickening) between 18 and 20 weeks of pregnancy. Follow these instructions at home:  Avoid all smoking, herbs, and alcohol. Avoid drugs not approved by your doctor.  Do not use any tobacco products, including cigarettes, chewing tobacco, and electronic cigarettes. If you need help quitting, ask your doctor. You may get counseling or other support to help you quit.  Only take medicine as told by your doctor. Some medicines are safe and some are not during pregnancy.  Exercise only as told by your doctor. Stop exercising if you start having cramps.  Eat regular, healthy meals.  Wear a good support bra if your breasts are tender.  Do not use hot tubs, steam rooms, or saunas.  Wear your seat belt when driving.  Avoid raw meat, uncooked cheese, and liter boxes and soil used by cats.  Take your prenatal vitamins.  Take 1500-2000 milligrams of calcium daily starting at the 20th week of pregnancy until you deliver your baby.  Try taking medicine that helps you poop (stool softener) as needed, and if your doctor approves. Eat more fiber by eating fresh fruit, vegetables, and whole grains. Drink enough fluids to keep your pee (urine) clear or pale yellow.  Take warm water baths (sitz baths) to soothe pain or discomfort caused by hemorrhoids. Use hemorrhoid cream if your doctor approves.  If you have puffy, bulging veins (varicose veins), wear support hose. Raise (elevate) your feet for 15 minutes, 3-4 times a day. Limit salt in your diet.  Avoid heavy  lifting, wear low heals, and sit up straight.  Rest with your legs raised if you have leg cramps or low back pain.  Visit your dentist if you have not gone during your pregnancy. Use a soft toothbrush to brush your teeth. Be gentle when you floss.  You can have sex (intercourse) unless your doctor tells you not to.  Go to your doctor visits. Get help if:  You feel dizzy.  You have mild cramps or pressure in your lower belly (abdomen).  You have a nagging pain in your belly area.  You continue to feel sick to your stomach (nauseous), throw up (vomit), or have watery poop (diarrhea).  You have bad smelling fluid coming from your vagina.  You have pain with peeing (urination). Get help right away if:  You have a fever.  You are leaking fluid from your vagina.  You have spotting or bleeding from your vagina.  You have severe belly cramping or pain.  You lose or gain weight rapidly.  You have trouble catching your breath and have chest pain.  You notice sudden or extreme puffiness (swelling) of your face, hands, ankles, feet, or legs.  You have not felt the baby move in over an hour.  You have severe headaches that do not go away with medicine.  You have vision changes. This information is not intended to replace advice given to you by your health care provider. Make sure you discuss any questions you have with your health care   provider. Document Released: 01/16/2010 Document Revised: 03/29/2016 Document Reviewed: 12/23/2012 Elsevier Interactive Patient Education  2017 Elsevier Inc.  

## 2018-02-04 NOTE — MAU Provider Note (Signed)
Chief Complaint:  Rupture of Membranes   First Provider Initiated Contact with Patient 02/04/18 2045  HPI: Chelsea Allen is a 37 y.o. [id number] at 48w1dwho presents to maternity admissions reporting leaking of fluid for several hours.  Happens off and on. .Does not require a pad. No bleeding or contractions She reports good fetal movement, denies vaginal bleeding, vaginal itching/burning, urinary symptoms, h/a, dizziness, n/v, diarrhea, constipation or fever/chills.    Vaginal Discharge  The patient's primary symptoms include vaginal discharge. The patient's pertinent negatives include no genital itching, genital lesions, genital odor, pelvic pain or vaginal bleeding. The current episode started today. The problem occurs intermittently. The problem has been unchanged. Pertinent negatives include no abdominal pain, constipation, diarrhea, fever, nausea or vomiting. The vaginal discharge was white and thin. There has been no bleeding. She has not been passing clots. She has not been passing tissue. Nothing aggravates the symptoms. She has tried nothing for the symptoms.   RN note: Leaking since 1200.  Clear, thin fluid.  Noticed the watery discharge everytime she moved throughout the day.  No large gush.  Pregnant with twins-good movement of both babies.  No VB.  No problems with the pregnancy.     Past Medical History: Past Medical History:  Diagnosis Date  . Anemia   . Anorexia nervosa with bulimia in college   none in 10+ years  . Depression   . H/O varicella   . Headache    Migraines  . Maternal anemia, with delivery 07/22/2012  . Mitral valve prolapse 2004  . Other disorder of muscle, ligament, and fascia    IITS-left knee  . Personal history of other mental disorder    anorexia nervosa  . PONV (postoperative nausea and vomiting)   . 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 Urinary tract infection, site not specified      Past obstetric history: OB History  Gravida Para Term Preterm AB Living  6 2 2   3 2   SAB TAB Ectopic Multiple Live Births  3     0 2    # Outcome Date GA Lbr Len/2nd Weight Sex Delivery Anes PTL Lv  6 Current           5 SAB 2018 [redacted]w[redacted]d         4 SAB 2017 [redacted]w[redacted]d         3 Term 03/07/15 [redacted]w[redacted]d 11:35 / 01:25 8 lb 10 oz (3.912 kg) M Vag-Spont EPI  LIV  2 Term 07/20/12 [redacted]w[redacted]d 13:35 / 01:10 8 lb 4.5 oz (3.755 kg) M Vag-Spont EPI  LIV  1 SAB 2011 [redacted]w[redacted]d           Past Surgical History: Past Surgical History:  Procedure Laterality Date  . DILATION AND EVACUATION N/A 11/27/2016   Procedure: DILATATION AND EVACUATION with Ultrasound Guidance and Chromosome Studies;  Surgeon: Olivia Mackie, MD;  Location: [facility];  Service: Gynecology;  Laterality: N/A;  . WISDOM TOOTH EXTRACTION      Family History: Family History  Problem Relation Age of Onset  . Depression Mother   . Depression Sister   . Depression Maternal Grandfather   . Cancer Paternal Grandfather        lung    Social History: Social History   Tobacco Use  . Smoking status: Never Smoker  . Smokeless tobacco: Never Used  Substance Use Topics  . Alcohol use: No    Frequency: Never  .  Drug use: No    Allergies:  Allergies  Allergen Reactions  . Beef-Derived Products Hives  . Lambs Xcel EnergyQuarters Hives  . Pork-Derived Conservation officer, natureroducts Hives  . Sulfonamide Derivatives Hives    Meds:  Medications Prior to Admission  Medication Sig Dispense Refill Last Dose  . Multiple Vitamins-Minerals (MULTIVITAMIN ADULT PO) Take by mouth.       I have reviewed patient's Past Medical Hx, Surgical Hx, Family Hx, Social Hx, medications and allergies.   ROS:  Review of Systems  Constitutional: Negative for fever.  Gastrointestinal: Negative for abdominal pain, constipation, diarrhea, nausea and vomiting.  Genitourinary: Positive for vaginal discharge. Negative for pelvic pain.   Other systems negative  Physical Exam   Patient Vitals  for the past 24 hrs:  BP Temp Pulse Resp SpO2 Height Weight  02/04/18 2012 117/71 99.2 F (37.3 C) 100 17 100 % 5\' 5"  (1.651 m) 163 lb 4 oz (74 kg)   Constitutional: Well-developed, well-nourished female in no acute distress.  Cardiovascular: normal rate and rhythm Respiratory: normal effort GI: Abd soft, non-tender, gravid appropriate for gestational age.   No rebound or guarding. MS: Extremities nontender, no edema, normal ROM Neurologic: Alert and oriented x 4.  GU: Neg CVAT.  PELVIC EXAM: Cervix pink, visually closed, without lesion, scant white milky discharge, vaginal walls and external genitalia normal   No pooling, no ferning   FHT:  Baseline 140s both twins , moderate variability, accelerations present, no decelerations Contractions:   Rare   Labs: No results found for this or any previous visit (from the past 24 hour(s)).    Imaging:  No results found.  MAU Course/MDM: NST reviewed and found to be reassuring for both twins which are quite active Consult Dr Billy Coastaavon with presentation, exam findings and test results. He does not recommend Amnisure given negative exam Treatments in MAU included Sterile speculum exam, EFM.    Assessment: Twin intrauterine pregnancy at 6625w1d Vaginal leaukorrhea No evidence of ruptured membranes  Plan: Discharge home Preterm Labor precautions and fetal kick counts Follow up in Office for prenatal visits and recheck of status  Encouraged to return here or to other Urgent Care/ED if she develops worsening of symptoms, increase in pain, fever, or other concerning symptoms.   Pt stable at time of discharge.  Wynelle BourgeoisMarie Aadi Allen CNM, MSN Certified Nurse-Midwife 02/04/2018 9:07 PM

## 2018-02-04 NOTE — MAU Note (Signed)
Leaking since 1200.  Clear, thin fluid.  Noticed the watery discharge everytime she moved throughout the day.  No large gush.  Pregnant with twins-good movement of both babies.  No VB.  No problems with the pregnancy.

## 2018-02-07 ENCOUNTER — Ambulatory Visit (HOSPITAL_COMMUNITY)
Admission: RE | Admit: 2018-02-07 | Discharge: 2018-02-07 | Disposition: A | Discharge status: Home / Self Care | Payer: BC Managed Care – PPO | Source: Ambulatory Visit | Attending: Obstetrics and Gynecology | Admitting: Obstetrics and Gynecology

## 2018-02-07 DIAGNOSIS — Z3A00 Weeks of gestation of pregnancy not specified: Secondary | ICD-10-CM | POA: Insufficient documentation

## 2018-02-07 DIAGNOSIS — O99019 Anemia complicating pregnancy, unspecified trimester: Secondary | ICD-10-CM | POA: Diagnosis present

## 2018-02-07 MED ORDER — FERUMOXYTOL INJECTION 510 MG/17 ML
510.0000 mg | Freq: Once | INTRAVENOUS | Status: AC
  Administered 2018-02-07: 09:00:00 510 mg via INTRAVENOUS
  Filled 2018-02-07: qty 17

## 2018-02-07 NOTE — Discharge Instructions (Signed)

## 2018-02-14 ENCOUNTER — Encounter (HOSPITAL_COMMUNITY): Payer: BC Managed Care – PPO

## 2018-02-17 ENCOUNTER — Other Ambulatory Visit (HOSPITAL_COMMUNITY): Payer: Self-pay | Admitting: *Deleted

## 2018-02-18 ENCOUNTER — Ambulatory Visit (HOSPITAL_COMMUNITY)
Admission: RE | Admit: 2018-02-18 | Discharge: 2018-02-18 | Disposition: A | Discharge status: Home / Self Care | Payer: BC Managed Care – PPO | Source: Ambulatory Visit | Attending: Obstetrics and Gynecology | Admitting: Obstetrics and Gynecology

## 2018-02-18 DIAGNOSIS — E611 Iron deficiency: Secondary | ICD-10-CM | POA: Diagnosis present

## 2018-02-18 MED ORDER — FERUMOXYTOL INJECTION 510 MG/17 ML
510.0000 mg | Freq: Once | INTRAVENOUS | Status: AC
  Administered 2018-02-18: 510 mg via INTRAVENOUS
  Filled 2018-02-18: qty 17

## 2018-03-19 ENCOUNTER — Other Ambulatory Visit: Payer: Self-pay | Admitting: Obstetrics and Gynecology

## 2018-03-24 ENCOUNTER — Encounter (HOSPITAL_COMMUNITY): Payer: Self-pay

## 2018-03-24 ENCOUNTER — Telehealth (HOSPITAL_COMMUNITY): Payer: Self-pay | Admitting: *Deleted

## 2018-03-24 NOTE — Telephone Encounter (Signed)
Preadmission screen  

## 2018-03-26 ENCOUNTER — Telehealth (HOSPITAL_COMMUNITY): Payer: Self-pay | Admitting: *Deleted

## 2018-03-26 NOTE — Telephone Encounter (Signed)
Preadmission screen  

## 2018-03-27 ENCOUNTER — Encounter (HOSPITAL_COMMUNITY): Payer: Self-pay

## 2018-04-03 NOTE — Patient Instructions (Signed)
COMFORT IVERSEN  04/03/2018   Your procedure is scheduled on:  04/05/2018  Enter through the Main Entrance of [facility] at 0745 AM.  Pick up the phone at the desk and dial [phone]  Call this number if you have problems the morning of surgery:[phone]  Remember:   Do not eat food:(After Midnight) Desps de medianoche.  Do not drink clear liquids: (After Midnight) Desps de medianoche.  Take these medicines the morning of surgery with A SIP OF WATER: none   Do not wear jewelry, make-up or nail polish.  Do not wear lotions, powders, or perfumes. Do not wear deodorant.  Do not shave 48 hours prior to surgery.  Do not bring valuables to the hospital.  [facility] is not   responsible for any belongings or valuables brought to the hospital.  Contacts, dentures or bridgework may not be worn into surgery.  Leave suitcase in the car. After surgery it may be brought to your room.  For patients admitted to the hospital, checkout time is 11:00 AM the day of              discharge.    N/A   Please read over the following fact sheets that you were given:   Surgical Site Infection Prevention

## 2018-04-04 ENCOUNTER — Encounter (HOSPITAL_COMMUNITY)
Admission: RE | Admit: 2018-04-04 | Discharge: 2018-04-04 | Disposition: A | Discharge status: Home / Self Care | Payer: BC Managed Care – PPO | Source: Ambulatory Visit | Attending: Obstetrics and Gynecology | Admitting: Obstetrics and Gynecology

## 2018-04-04 LAB — TYPE AND SCREEN
ABO/RH(D): O POS
ANTIBODY SCREEN: NEGATIVE

## 2018-04-04 LAB — CBC
HEMATOCRIT: 34.3 % — AB (ref 36.0–46.0)
HEMOGLOBIN: 11 g/dL — AB (ref 12.0–15.0)
MCH: 28.3 pg (ref 26.0–34.0)
MCHC: 32.1 g/dL (ref 30.0–36.0)
MCV: 88.2 fL (ref 78.0–100.0)
Platelets: 191 10*3/uL (ref 150–400)
RBC: 3.89 MIL/uL (ref 3.87–5.11)
RDW: 24.2 % — ABNORMAL HIGH (ref 11.5–15.5)
WBC: 9 10*3/uL (ref 4.0–10.5)

## 2018-04-05 ENCOUNTER — Encounter (HOSPITAL_COMMUNITY): Payer: Self-pay | Admitting: Anesthesiology

## 2018-04-05 ENCOUNTER — Encounter (HOSPITAL_COMMUNITY)
Admission: RE | Disposition: A | Discharge status: Home / Self Care | Payer: Self-pay | Source: Home / Self Care | Attending: Obstetrics and Gynecology

## 2018-04-05 ENCOUNTER — Inpatient Hospital Stay (HOSPITAL_COMMUNITY): Payer: BC Managed Care – PPO | Admitting: Anesthesiology

## 2018-04-05 ENCOUNTER — Other Ambulatory Visit: Payer: Self-pay

## 2018-04-05 ENCOUNTER — Inpatient Hospital Stay (HOSPITAL_COMMUNITY)
Admission: RE | Admit: 2018-04-05 | Discharge: 2018-04-08 | DRG: 783 | Disposition: A | Discharge status: Home / Self Care | Payer: BC Managed Care – PPO | Attending: Obstetrics and Gynecology | Admitting: Obstetrics and Gynecology

## 2018-04-05 DIAGNOSIS — Z3A36 36 weeks gestation of pregnancy: Secondary | ICD-10-CM | POA: Diagnosis not present

## 2018-04-05 DIAGNOSIS — D6859 Other primary thrombophilia: Secondary | ICD-10-CM | POA: Diagnosis present

## 2018-04-05 DIAGNOSIS — D62 Acute posthemorrhagic anemia: Secondary | ICD-10-CM | POA: Diagnosis not present

## 2018-04-05 DIAGNOSIS — O321XX1 Maternal care for breech presentation, fetus 1: Secondary | ICD-10-CM | POA: Diagnosis present

## 2018-04-05 DIAGNOSIS — O9942 Diseases of the circulatory system complicating childbirth: Secondary | ICD-10-CM | POA: Diagnosis present

## 2018-04-05 DIAGNOSIS — O9912 Other diseases of the blood and blood-forming organs and certain disorders involving the immune mechanism complicating childbirth: Secondary | ICD-10-CM | POA: Diagnosis present

## 2018-04-05 DIAGNOSIS — O365931 Maternal care for other known or suspected poor fetal growth, third trimester, fetus 1: Secondary | ICD-10-CM | POA: Diagnosis present

## 2018-04-05 DIAGNOSIS — O322XX1 Maternal care for transverse and oblique lie, fetus 1: Secondary | ICD-10-CM | POA: Diagnosis present

## 2018-04-05 DIAGNOSIS — Z7982 Long term (current) use of aspirin: Secondary | ICD-10-CM | POA: Diagnosis not present

## 2018-04-05 DIAGNOSIS — O9081 Anemia of the puerperium: Secondary | ICD-10-CM | POA: Diagnosis not present

## 2018-04-05 DIAGNOSIS — O30033 Twin pregnancy, monochorionic/diamniotic, third trimester: Secondary | ICD-10-CM | POA: Diagnosis present

## 2018-04-05 DIAGNOSIS — I341 Nonrheumatic mitral (valve) prolapse: Secondary | ICD-10-CM | POA: Diagnosis present

## 2018-04-05 DIAGNOSIS — Z302 Encounter for sterilization: Secondary | ICD-10-CM | POA: Diagnosis not present

## 2018-04-05 LAB — RPR: RPR: NONREACTIVE

## 2018-04-05 SURGERY — Surgical Case
Anesthesia: Spinal

## 2018-04-05 MED ORDER — MORPHINE SULFATE (PF) 0.5 MG/ML IJ SOLN
INTRAMUSCULAR | Status: DC | PRN
  Administered 2018-04-05: .2 mg via INTRATHECAL

## 2018-04-05 MED ORDER — SIMETHICONE 80 MG PO CHEW: 80.0000 mg | CHEWABLE_TABLET | ORAL | Status: DC | PRN

## 2018-04-05 MED ORDER — FENTANYL CITRATE (PF) 100 MCG/2ML IJ SOLN: 25.0000 ug | INTRAMUSCULAR | Status: DC | PRN

## 2018-04-05 MED ORDER — SCOPOLAMINE 1 MG/3DAYS TD PT72
MEDICATED_PATCH | TRANSDERMAL | Status: DC | PRN
  Administered 2018-04-05: 1 via TRANSDERMAL

## 2018-04-05 MED ORDER — LIDOCAINE HCL (PF) 1 % IJ SOLN
INTRAMUSCULAR | Status: AC
  Filled 2018-04-05: qty 30

## 2018-04-05 MED ORDER — NALOXONE HCL 0.4 MG/ML IJ SOLN: 0.4000 mg | INTRAMUSCULAR | Status: DC | PRN

## 2018-04-05 MED ORDER — KETOROLAC TROMETHAMINE 30 MG/ML IJ SOLN
30.0000 mg | Freq: Four times a day (QID) | INTRAMUSCULAR | Status: DC | PRN
  Administered 2018-04-05 (×2): 30 mg via INTRAVENOUS
  Filled 2018-04-05: qty 1

## 2018-04-05 MED ORDER — METHYLERGONOVINE MALEATE 0.2 MG PO TABS: 0.2000 mg | ORAL_TABLET | ORAL | Status: DC | PRN

## 2018-04-05 MED ORDER — LACTATED RINGERS IV SOLN: INTRAVENOUS | Status: DC

## 2018-04-05 MED ORDER — FENTANYL CITRATE (PF) 100 MCG/2ML IJ SOLN
INTRAMUSCULAR | Status: AC
  Filled 2018-04-05: qty 2

## 2018-04-05 MED ORDER — DIPHENHYDRAMINE HCL 50 MG/ML IJ SOLN: 12.5000 mg | INTRAMUSCULAR | Status: DC | PRN

## 2018-04-05 MED ORDER — OXYCODONE-ACETAMINOPHEN 5-325 MG PO TABS
2.0000 | ORAL_TABLET | ORAL | Status: DC | PRN
  Administered 2018-04-06 – 2018-04-08 (×8): 2 via ORAL
  Filled 2018-04-05 (×7): qty 2

## 2018-04-05 MED ORDER — IBUPROFEN 600 MG PO TABS: 600.0000 mg | ORAL_TABLET | Freq: Four times a day (QID) | ORAL | Status: DC

## 2018-04-05 MED ORDER — DIPHENHYDRAMINE HCL 25 MG PO CAPS
25.0000 mg | ORAL_CAPSULE | ORAL | Status: DC | PRN
  Administered 2018-04-05 (×2): 25 mg via ORAL
  Filled 2018-04-05 (×3): qty 1

## 2018-04-05 MED ORDER — BUPIVACAINE IN DEXTROSE 0.75-8.25 % IT SOLN
INTRATHECAL | Status: DC | PRN
  Administered 2018-04-05: 1.6 mL via INTRATHECAL

## 2018-04-05 MED ORDER — OXYTOCIN 10 UNIT/ML IJ SOLN
INTRAMUSCULAR | Status: AC
  Filled 2018-04-05: qty 1

## 2018-04-05 MED ORDER — ACETAMINOPHEN 325 MG PO TABS: 650.0000 mg | ORAL_TABLET | ORAL | Status: DC | PRN

## 2018-04-05 MED ORDER — ONDANSETRON HCL 4 MG/2ML IJ SOLN
INTRAMUSCULAR | Status: DC | PRN
  Administered 2018-04-05: 4 mg via INTRAVENOUS

## 2018-04-05 MED ORDER — ONDANSETRON HCL 4 MG/2ML IJ SOLN
INTRAMUSCULAR | Status: AC
  Filled 2018-04-05: qty 2

## 2018-04-05 MED ORDER — NALBUPHINE HCL 10 MG/ML IJ SOLN
5.0000 mg | INTRAMUSCULAR | Status: DC | PRN
  Administered 2018-04-06: 5 mg via SUBCUTANEOUS
  Filled 2018-04-05: qty 1

## 2018-04-05 MED ORDER — SIMETHICONE 80 MG PO CHEW
80.0000 mg | CHEWABLE_TABLET | Freq: Three times a day (TID) | ORAL | Status: DC
  Administered 2018-04-05 – 2018-04-08 (×9): 80 mg via ORAL
  Filled 2018-04-05 (×9): qty 1

## 2018-04-05 MED ORDER — IBUPROFEN 600 MG PO TABS
600.0000 mg | ORAL_TABLET | Freq: Four times a day (QID) | ORAL | Status: DC
  Administered 2018-04-05 – 2018-04-08 (×12): 600 mg via ORAL
  Filled 2018-04-05 (×12): qty 1

## 2018-04-05 MED ORDER — OXYCODONE-ACETAMINOPHEN 5-325 MG PO TABS
1.0000 | ORAL_TABLET | ORAL | Status: DC | PRN
  Administered 2018-04-06 – 2018-04-08 (×6): 1 via ORAL
  Filled 2018-04-05 (×10): qty 1

## 2018-04-05 MED ORDER — CEFAZOLIN SODIUM-DEXTROSE 2-4 GM/100ML-% IV SOLN
INTRAVENOUS | Status: AC
Start: 2018-04-05 — End: ?
  Filled 2018-04-05: qty 100

## 2018-04-05 MED ORDER — BUPIVACAINE HCL (PF) 0.25 % IJ SOLN
INTRAMUSCULAR | Status: DC | PRN
  Administered 2018-04-05: 20 mL

## 2018-04-05 MED ORDER — MORPHINE SULFATE (PF) 0.5 MG/ML IJ SOLN
INTRAMUSCULAR | Status: AC
Start: 2018-04-05 — End: ?
  Filled 2018-04-05: qty 10

## 2018-04-05 MED ORDER — NALBUPHINE HCL 10 MG/ML IJ SOLN: 5.0000 mg | Freq: Once | INTRAMUSCULAR | Status: DC | PRN

## 2018-04-05 MED ORDER — PHENYLEPHRINE 8 MG IN D5W 100 ML (0.08MG/ML) PREMIX OPTIME
INJECTION | INTRAVENOUS | Status: AC
  Filled 2018-04-05: qty 100

## 2018-04-05 MED ORDER — DIBUCAINE 1 % RE OINT: 1.0000 "application " | TOPICAL_OINTMENT | RECTAL | Status: DC | PRN

## 2018-04-05 MED ORDER — OXYTOCIN 40 UNITS IN LACTATED RINGERS INFUSION - SIMPLE MED
2.5000 [IU]/h | INTRAVENOUS | Status: DC
  Filled 2018-04-05: qty 1000

## 2018-04-05 MED ORDER — NALBUPHINE HCL 10 MG/ML IJ SOLN
5.0000 mg | INTRAMUSCULAR | Status: DC | PRN
  Administered 2018-04-05 – 2018-04-07 (×4): 5 mg via INTRAVENOUS
  Filled 2018-04-05 (×4): qty 1

## 2018-04-05 MED ORDER — PHENYLEPHRINE 8 MG IN D5W 100 ML (0.08MG/ML) PREMIX OPTIME
INJECTION | INTRAVENOUS | Status: DC | PRN
  Administered 2018-04-05: 60 ug/min via INTRAVENOUS

## 2018-04-05 MED ORDER — ACETAMINOPHEN 500 MG PO TABS
1000.0000 mg | ORAL_TABLET | Freq: Four times a day (QID) | ORAL | Status: DC | PRN
  Administered 2018-04-05: 1000 mg via ORAL
  Filled 2018-04-05: qty 2

## 2018-04-05 MED ORDER — METHYLERGONOVINE MALEATE 0.2 MG/ML IJ SOLN: 0.2000 mg | INTRAMUSCULAR | Status: DC | PRN

## 2018-04-05 MED ORDER — ZOLPIDEM TARTRATE 5 MG PO TABS: 5.0000 mg | ORAL_TABLET | Freq: Every evening | ORAL | Status: DC | PRN

## 2018-04-05 MED ORDER — DIPHENHYDRAMINE HCL 50 MG/ML IJ SOLN
INTRAMUSCULAR | Status: AC
  Administered 2018-04-05: 12.5 mg
  Filled 2018-04-05: qty 1

## 2018-04-05 MED ORDER — SCOPOLAMINE 1 MG/3DAYS TD PT72: 1.0000 | MEDICATED_PATCH | Freq: Once | TRANSDERMAL | Status: DC

## 2018-04-05 MED ORDER — MENTHOL 3 MG MT LOZG: 1.0000 | LOZENGE | OROMUCOSAL | Status: DC | PRN

## 2018-04-05 MED ORDER — MEPERIDINE HCL 25 MG/ML IJ SOLN: 6.2500 mg | INTRAMUSCULAR | Status: DC | PRN

## 2018-04-05 MED ORDER — CEFAZOLIN SODIUM-DEXTROSE 2-4 GM/100ML-% IV SOLN
2.0000 g | INTRAVENOUS | Status: AC
  Administered 2018-04-05: 2 g via INTRAVENOUS

## 2018-04-05 MED ORDER — DIPHENHYDRAMINE HCL 25 MG PO CAPS: 25.0000 mg | ORAL_CAPSULE | Freq: Four times a day (QID) | ORAL | Status: DC | PRN

## 2018-04-05 MED ORDER — NALOXONE HCL 4 MG/10ML IJ SOLN
1.0000 ug/kg/h | INTRAVENOUS | Status: DC | PRN
  Filled 2018-04-05: qty 5

## 2018-04-05 MED ORDER — WITCH HAZEL-GLYCERIN EX PADS: 1.0000 "application " | MEDICATED_PAD | CUTANEOUS | Status: DC | PRN

## 2018-04-05 MED ORDER — TETANUS-DIPHTH-ACELL PERTUSSIS 5-2.5-18.5 LF-MCG/0.5 IM SUSP: 0.5000 mL | Freq: Once | INTRAMUSCULAR | Status: DC

## 2018-04-05 MED ORDER — ACETAMINOPHEN 500 MG PO TABS: 1000.0000 mg | ORAL_TABLET | Freq: Four times a day (QID) | ORAL | Status: DC | PRN

## 2018-04-05 MED ORDER — KETOROLAC TROMETHAMINE 30 MG/ML IJ SOLN
30.0000 mg | Freq: Four times a day (QID) | INTRAMUSCULAR | Status: DC | PRN
  Filled 2018-04-05: qty 1

## 2018-04-05 MED ORDER — FENTANYL CITRATE (PF) 100 MCG/2ML IJ SOLN
INTRAMUSCULAR | Status: DC | PRN
  Administered 2018-04-05: 10 ug via INTRATHECAL

## 2018-04-05 MED ORDER — FENTANYL CITRATE (PF) 100 MCG/2ML IJ SOLN
25.0000 ug | INTRAMUSCULAR | Status: DC | PRN
  Administered 2018-04-05 (×2): 50 ug via INTRAVENOUS

## 2018-04-05 MED ORDER — SIMETHICONE 80 MG PO CHEW
80.0000 mg | CHEWABLE_TABLET | ORAL | Status: DC
  Administered 2018-04-05 – 2018-04-07 (×3): 80 mg via ORAL
  Filled 2018-04-05 (×3): qty 1

## 2018-04-05 MED ORDER — PRENATAL MULTIVITAMIN CH
1.0000 | ORAL_TABLET | Freq: Every day | ORAL | Status: DC
  Administered 2018-04-06 – 2018-04-08 (×3): 1 via ORAL
  Filled 2018-04-05 (×3): qty 1

## 2018-04-05 MED ORDER — SENNOSIDES-DOCUSATE SODIUM 8.6-50 MG PO TABS
2.0000 | ORAL_TABLET | ORAL | Status: DC
  Administered 2018-04-05 – 2018-04-07 (×3): 2 via ORAL
  Filled 2018-04-05 (×3): qty 2

## 2018-04-05 MED ORDER — DEXAMETHASONE SODIUM PHOSPHATE 4 MG/ML IJ SOLN
INTRAMUSCULAR | Status: AC
  Filled 2018-04-05: qty 1

## 2018-04-05 MED ORDER — KETOROLAC TROMETHAMINE 30 MG/ML IJ SOLN
INTRAMUSCULAR | Status: AC
  Filled 2018-04-05: qty 1

## 2018-04-05 MED ORDER — PROMETHAZINE HCL 25 MG/ML IJ SOLN: 6.2500 mg | INTRAMUSCULAR | Status: DC | PRN

## 2018-04-05 MED ORDER — LACTATED RINGERS IV SOLN
INTRAVENOUS | Status: DC
  Administered 2018-04-05 (×2): via INTRAVENOUS

## 2018-04-05 MED ORDER — SODIUM CHLORIDE 0.9% FLUSH: 3.0000 mL | INTRAVENOUS | Status: DC | PRN

## 2018-04-05 MED ORDER — COCONUT OIL OIL
1.0000 "application " | TOPICAL_OIL | Status: DC | PRN
  Administered 2018-04-08: 1 via TOPICAL
  Filled 2018-04-05: qty 120

## 2018-04-05 MED ORDER — BUPIVACAINE HCL (PF) 0.25 % IJ SOLN
INTRAMUSCULAR | Status: AC
  Filled 2018-04-05: qty 30

## 2018-04-05 MED ORDER — ONDANSETRON HCL 4 MG/2ML IJ SOLN: 4.0000 mg | Freq: Three times a day (TID) | INTRAMUSCULAR | Status: DC | PRN

## 2018-04-05 MED ORDER — OXYTOCIN 10 UNIT/ML IJ SOLN
INTRAVENOUS | Status: DC | PRN
  Administered 2018-04-05: 40 [IU] via INTRAVENOUS

## 2018-04-05 MED ORDER — DEXAMETHASONE SODIUM PHOSPHATE 4 MG/ML IJ SOLN
INTRAMUSCULAR | Status: DC | PRN
  Administered 2018-04-05: 4 mg via INTRAVENOUS

## 2018-04-05 SURGICAL SUPPLY — 38 items
CHLORAPREP W/TINT 26ML (MISCELLANEOUS) ×3 IMPLANT
CLAMP CORD UMBIL (MISCELLANEOUS) IMPLANT
CLOTH BEACON ORANGE TIMEOUT ST (SAFETY) ×3 IMPLANT
DRAPE LG THREE QUARTER DISP (DRAPES) ×3 IMPLANT
DRSG OPSITE POSTOP 4X10 (GAUZE/BANDAGES/DRESSINGS) ×3 IMPLANT
ELECT REM PT RETURN 9FT ADLT (ELECTROSURGICAL) ×3
ELECTRODE REM PT RTRN 9FT ADLT (ELECTROSURGICAL) ×1 IMPLANT
EXTRACTOR VACUUM M CUP 4 TUBE (SUCTIONS) IMPLANT
EXTRACTOR VACUUM M CUP 4' TUBE (SUCTIONS)
GLOVE BIO SURGEON STRL SZ7.5 (GLOVE) ×3 IMPLANT
GLOVE BIOGEL PI IND STRL 7.0 (GLOVE) ×2 IMPLANT
GLOVE BIOGEL PI INDICATOR 7.0 (GLOVE) ×4
GOWN STRL REUS W/TWL LRG LVL3 (GOWN DISPOSABLE) ×9 IMPLANT
KIT ABG SYR 3ML LUER SLIP (SYRINGE) IMPLANT
NDL SAFETY ECLIPSE 18X1.5 (NEEDLE) ×1 IMPLANT
NEEDLE HYPO 18GX1.5 SHARP (NEEDLE) ×2
NEEDLE HYPO 22GX1.5 SAFETY (NEEDLE) ×3 IMPLANT
NEEDLE HYPO 25X5/8 SAFETYGLIDE (NEEDLE) IMPLANT
NEEDLE SPNL 20GX3.5 QUINCKE YW (NEEDLE) IMPLANT
NS IRRIG 1000ML POUR BTL (IV SOLUTION) ×3 IMPLANT
PACK C SECTION WH (CUSTOM PROCEDURE TRAY) ×3 IMPLANT
PAD ABD 7.5X8 STRL (GAUZE/BANDAGES/DRESSINGS) ×6 IMPLANT
PENCIL SMOKE EVAC W/HOLSTER (ELECTROSURGICAL) ×3 IMPLANT
SUT MNCRL 0 VIOLET CTX 36 (SUTURE) ×2 IMPLANT
SUT MNCRL AB 3-0 PS2 27 (SUTURE) IMPLANT
SUT MON AB 2-0 CT1 27 (SUTURE) ×3 IMPLANT
SUT MON AB-0 CT1 36 (SUTURE) ×6 IMPLANT
SUT MONOCRYL 0 CTX 36 (SUTURE) ×4
SUT PLAIN 0 NONE (SUTURE) ×6 IMPLANT
SUT PLAIN 2 0 (SUTURE)
SUT PLAIN 2 0 XLH (SUTURE) IMPLANT
SUT PLAIN ABS 2-0 CT1 27XMFL (SUTURE) IMPLANT
SYR 20CC LL (SYRINGE) IMPLANT
SYR BULB 3OZ (MISCELLANEOUS) ×3 IMPLANT
SYR CONTROL 10ML LL (SYRINGE) ×3 IMPLANT
SYRINGE 10CC LL (SYRINGE) ×3 IMPLANT
TOWEL OR 17X24 6PK STRL BLUE (TOWEL DISPOSABLE) ×3 IMPLANT
TRAY FOLEY W/BAG SLVR 14FR LF (SET/KITS/TRAYS/PACK) ×3 IMPLANT

## 2018-04-05 NOTE — Progress Notes (Signed)
Pt IV is located in the right [facility].  IV pump consistently d/t pt's movement of arm to care for the babies and for breastfeeding.  Pt declined to have the location of the IV changed.

## 2018-04-05 NOTE — Addendum Note (Signed)
Addendum  created 04/05/18 1505 by Shelton SilvasHollis, Kevin D, MD   Order list changed

## 2018-04-05 NOTE — Anesthesia Postprocedure Evaluation (Signed)
Anesthesia Post Note  Patient: Chelsea Allen  Procedure(s) Performed: Primary CESAREAN SECTION MULTI-GESTATIONAL (N/A )     Patient location during evaluation: PACU Anesthesia Type: Spinal Level of consciousness: oriented and awake and alert Pain management: pain level controlled Vital Signs Assessment: post-procedure vital signs reviewed and stable Respiratory status: spontaneous breathing, respiratory function stable and patient connected to nasal cannula oxygen Cardiovascular status: blood pressure returned to baseline and stable Postop Assessment: no headache, no backache, no apparent nausea or vomiting, spinal receding and patient able to bend at knees Anesthetic complications: no    Last Vitals:  Vitals:   04/05/18 1305 04/05/18 1310  BP:    Pulse: (!) 53 77  Resp: (!) 9   Temp:    SpO2: 98% 98%    Last Pain:  Vitals:   04/05/18 1330  TempSrc:   PainSc: 5    Pain Goal: Patients Stated Pain Goal: 2 (04/05/18 1330)               Shelton SilvasKevin D Neeya Prigmore

## 2018-04-05 NOTE — Op Note (Addendum)
Cesarean Section Procedure Note  Indications:  Monochorionic Diamniotic twins Malpresentation twin A and Twin B Desire for elective sterilization IUGR twin A  Pre-operative Diagnosis: 36 week 4 day pregnancy.  Post-operative Diagnosis: same  Surgeon: Lenoard AdenAAVON,Indio Santilli J   Assistants: Montez HagemanBailey, FACM, CNM  Anesthesia: Local anesthesia 0.25.% bupivacaine and Spinal anesthesia  ASA Class: 2  Procedure Details  The patient was seen in the Holding Room. The risks, benefits, complications, treatment options, and expected outcomes were discussed with the patient.  The patient concurred with the proposed plan, giving informed consent. The risks of anesthesia, infection, bleeding and possible injury to other organs discussed. Injury to bowel, bladder, or ureter with possible need for repair discussed. Possible need for transfusion with secondary risks of hepatitis or HIV acquisition discussed. Post operative complications to include but not limited to DVT, PE and Pneumonia noted. The site of surgery properly noted/marked. The patient was taken to Operating Room # 9, identified as Sanjuan DameMeredith T Obar and the procedure verified as C-Section Delivery. A Time Out was held and the above information confirmed.  After induction of anesthesia, the patient was draped and prepped in the usual sterile manner. A Pfannenstiel incision was made and carried down through the subcutaneous tissue to the fascia. Fascial incision was made and extended transversely using Mayo scissors. The fascia was separated from the underlying rectus tissue superiorly and inferiorly. The peritoneum was identified and entered. Peritoneal incision was extended longitudinally. The utero-vesical peritoneal reflection was incised transversely and the bladder flap was bluntly freed from the lower uterine segment. A low transverse uterine incision(Kerr hysterotomy) was made. Delivered from right shoulder converted to vertex/hand  presentation was a   Female twin A with Apgar scores of 9 at one minute and 9 at five minutes. Bulb suctioning gently performed.elivered from right transverse to vertex  presentation was a  Female twin B with Apgar scores of 8 at one minute and 9 at five minutes. Bulb suctioning gently performed.  Neonatal team in attendance.After the umbilical cord was clamped and cut cord blood was obtained for evaluation. The placenta was removed intact and appeared normal. The uterus was curetted with a dry lap pack. Good hemostasis was noted.The uterine outline, tubes and ovaries appeared normal. The uterine incision was closed with running locked sutures of 0 Monocryl x 1 layers. Second suture at left lateral margin for hemostasis.  Hemostasis was observed. Modified Pomeroy bilateral partial salpingectomy in standard fashion without complications. Good hemostasis noted. The parietal peritoneum was closed with a running 2-0 Monocryl suture. The fascia was then reapproximated with running sutures of 0 Monocryl. The skin was reapproximated with 3-0 monocryl after Trumbull closure with 2-0 plain.  Instrument, sponge, and needle counts were correct prior the abdominal closure and at the conclusion of the case.   Findings: As noted.   Estimated Blood Loss:  600         Drains: foley                 Specimens: placenta, bilateral tubal segments                 Complications:  None; patient tolerated the procedure well.         Disposition: PACU - hemodynamically stable.         Condition: stable  Attending Attestation: I performed the procedure.

## 2018-04-05 NOTE — Addendum Note (Signed)
Addendum  created 04/05/18 1619 by Lasha Echeverria, Doree Fudgeolleen S, CRNA   Sign clinical note

## 2018-04-05 NOTE — Lactation Note (Signed)
This note was copied from a baby's chart. Lactation Consultation Note  Patient Name: Chelsea Allen ZOXWR'UToday's Date: 04/05/2018    PACU called because mother is requesting a nipple shield due to nipple soreness.  She has used them in the past. Twins 1 hour old.  Baby A is latched on R breast with lips flanged.  Sucks and swallows observed. Baby B breastfed for 30 min on L breast and nipple is pink with blister forming. Provided mother with #20NS.  Attempted to provide education regarding using nipple shield and alternatives but mother declined further assistance at this time.   Maternal Data    Feeding Feeding Type: Breast Fed  LATCH Score Latch: Grasps breast easily, tongue down, lips flanged, rhythmical sucking.  Audible Swallowing: A few with stimulation  Type of Nipple: Everted at rest and after stimulation  Comfort (Breast/Nipple): Filling, red/small blisters or bruises, mild/mod discomfort  Hold (Positioning): No assistance needed to correctly position infant at breast.  LATCH Score: 8  Interventions    Lactation Tools Discussed/Used     Consult Status      [name], [name] 04/05/2018, 12:21 PM

## 2018-04-05 NOTE — Transfer of Care (Signed)
Immediate Anesthesia Transfer of Care Note  Patient: Chelsea Allen  Procedure(s) Performed: Primary CESAREAN SECTION MULTI-GESTATIONAL (N/A )  Patient Location: PACU  Anesthesia Type:Spinal  Level of Consciousness: awake, alert , oriented and patient cooperative  Airway & Oxygen Therapy: Patient Spontanous Breathing  Post-op Assessment: Report given to RN and Post -op Vital signs reviewed and stable  Post vital signs: Reviewed and stable  Last Vitals:  Vitals Value Taken Time  BP    Temp    Pulse    Resp 20 04/05/2018 11:28 AM  SpO2    Vitals shown include unvalidated device data.  Last Pain:  Vitals:   04/05/18 0835  TempSrc: Oral  PainSc:          Complications: No apparent anesthesia complications

## 2018-04-05 NOTE — H&P (Signed)
Chelsea Allen is a 37 y.o. female presenting for primary csection for monochorionic diamniotic twin gestation with malpresentaton of twin A and IUGR twin A. OB History    Gravida  6   Para  2   Term  2   Preterm      AB  3   Living  2     SAB  3   TAB      Ectopic      Multiple  0   Live Births  2          Past Medical History:  Diagnosis Date  . Anemia   . Anorexia nervosa with bulimia in college   none in 10+ years  . Depression   . H/O varicella   . Headache    Migraines  . Maternal anemia, with delivery 07/22/2012  . Mitral valve prolapse 2004  . Other disorder of muscle, ligament, and fascia    IITS-left knee  . Personal history of other mental disorder    anorexia nervosa  . PONV (postoperative nausea and vomiting)   . 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 Urinary tract infection, site not specified    Past Surgical History:  Procedure Laterality Date  . DILATION AND EVACUATION N/A 11/27/2016   Procedure: DILATATION AND EVACUATION with Ultrasound Guidance and Chromosome Studies;  Surgeon: Olivia Mackie, MD;  Location: [facility];  Service: Gynecology;  Laterality: N/A;  . WISDOM TOOTH EXTRACTION     Family History: family history includes Breast cancer in her maternal grandmother; Cancer in her paternal grandfather; Depression in her maternal grandfather, mother, and sister. Social History:  reports that she has never smoked. She has never used smokeless tobacco. She reports that she does not drink alcohol or use drugs.     Maternal Diabetes: No Genetic Screening: Normal Maternal Ultrasounds/Referrals: Normal Fetal Ultrasounds or other Referrals:  None Maternal Substance Abuse:  No Significant Maternal Medications:  None Significant Maternal Lab Results:  None Other Comments:  None  Review of Systems  Constitutional: Negative.   All other systems reviewed and are negative.  Maternal  Medical History:  Contractions: Onset was less than 1 hour ago.   Frequency: rare.   Perceived severity is mild.    Fetal activity: Perceived fetal activity is normal.   Last perceived fetal movement was within the past hour.    Prenatal complications: IUGR.   Prenatal Complications - Diabetes: none.      Blood pressure 113/62, pulse 83, temperature 98.5 F (36.9 C), temperature source Oral, resp. rate 18, height 5\' 4"  (1.626 m), weight 76.7 kg (169 lb), unknown if currently breastfeeding. Maternal Exam:  Uterine Assessment: Contraction strength is mild.  Contraction frequency is rare.   Abdomen: Patient reports no abdominal tenderness. Fetal presentation: shoulder  Introitus: Normal vulva. Normal vagina.  Ferning test: negative.  Nitrazine test: negative. Amniotic fluid character: not assessed.  Pelvis: adequate for delivery.   Cervix: Cervix evaluated by digital exam.     Physical Exam  Nursing note and vitals reviewed. Constitutional: She is oriented to person, place, and time. She appears well-developed and well-nourished.  HENT:  Head: Normocephalic and atraumatic.  Neck: Normal range of motion. Neck supple.  Cardiovascular: Normal rate and regular rhythm.  GI: Soft. Bowel sounds are normal.  Genitourinary: Vagina normal and uterus normal. Rectal exam shows guaiac negative stool.  Musculoskeletal: Normal range of motion.  Neurological: She is alert  and oriented to person, place, and time. She has normal reflexes.  Skin: Skin is warm and dry.  Psychiatric: She has a normal mood and affect.    Prenatal labs: ABO, Rh: --/--/O POS (05/31 1101) Antibody: NEG (05/31 1101) Rubella: Immune (11/21 0000) RPR: Non Reactive (05/31 1101)  HBsAg: Negative (11/21 0000)  HIV: Non-reactive (11/21 0000)  GBS:     Assessment/Plan: 36+ weeks MonoC Di A twins IUGR twin A Malpresentation twin A HIstory of recurrent second trimester pregnancy loss and PT gene mutation with pork  allergy.  Primary csection Consent done   Hadriel Northup J 04/05/2018, 8:43 AM

## 2018-04-05 NOTE — Addendum Note (Signed)
Addendum  created 04/05/18 1501 by Shelton SilvasHollis, Ayano Douthitt D, MD   Order list changed

## 2018-04-05 NOTE — Anesthesia Postprocedure Evaluation (Signed)
Anesthesia Post Note  Patient: Chelsea Allen  Procedure(s) Performed: Primary CESAREAN SECTION MULTI-GESTATIONAL (N/A )     Patient location during evaluation: Mother Baby Anesthesia Type: Spinal Level of consciousness: awake, awake and alert and oriented Pain management: pain level controlled Vital Signs Assessment: post-procedure vital signs reviewed and stable Respiratory status: spontaneous breathing, nonlabored ventilation and respiratory function stable Cardiovascular status: stable Postop Assessment: no headache, no backache, patient able to bend at knees, no apparent nausea or vomiting, adequate PO intake and able to ambulate Anesthetic complications: no    Last Vitals:  Vitals:   04/05/18 1305 04/05/18 1310  BP:    Pulse: (!) 53 77  Resp: (!) 9   Temp:    SpO2: 98% 98%    Last Pain:  Vitals:   04/05/18 1543  TempSrc:   PainSc: Asleep   Pain Goal: Patients Stated Pain Goal: 2 (04/05/18 1330)               Rayn Shorb

## 2018-04-05 NOTE — Anesthesia Preprocedure Evaluation (Addendum)
Anesthesia Evaluation  Patient identified by MRN, date of birth, ID band Patient awake    Reviewed: Allergy & Precautions, NPO status , Patient's Chart, lab work & pertinent test results  History of Anesthesia Complications (+) PONV and history of anesthetic complications  Airway Mallampati: II  TM Distance: >3 FB Neck ROM: Full    Dental  (+) Teeth Intact, Dental Advisory Given   Pulmonary    breath sounds clear to auscultation       Cardiovascular  Rhythm:Regular Rate:Normal     Neuro/Psych  Headaches, PSYCHIATRIC DISORDERS Depression    GI/Hepatic negative GI ROS, Neg liver ROS,   Endo/Other  negative endocrine ROS  Renal/GU negative Renal ROS     Musculoskeletal negative musculoskeletal ROS (+)   Abdominal   Peds  Hematology  (+) anemia ,   Anesthesia Other Findings   Reproductive/Obstetrics                            Anesthesia Physical Anesthesia Plan  ASA: II  Anesthesia Plan: Spinal   Post-op Pain Management:    Induction:   PONV Risk Score and Plan: Ondansetron and Treatment may vary due to age or medical condition  Airway Management Planned: Natural Airway  Additional Equipment: None  Intra-op Plan:   Post-operative Plan:   Informed Consent: I have reviewed the patients History and Physical, chart, labs and discussed the procedure including the risks, benefits and alternatives for the proposed anesthesia with the patient or authorized representative who has indicated his/her understanding and acceptance.     Plan Discussed with: CRNA  Anesthesia Plan Comments: (Lab Results      Component                Value               Date                      WBC                      9.0                 04/04/2018                HGB                      11.0 (L)            04/04/2018                HCT                      34.3 (L)            04/04/2018            MCV                      88.2                04/04/2018                PLT                      191                 04/04/2018  No results found for: INR, PROTIME )       Anesthesia Quick Evaluation

## 2018-04-05 NOTE — Anesthesia Procedure Notes (Signed)
Spinal  Patient location during procedure: OR Start time: 04/05/2018 10:06 AM End time: 04/05/2018 10:08 AM Staffing Anesthesiologist: Effie Berkshire, MD Performed: anesthesiologist  Preanesthetic Checklist Completed: patient identified, site marked, surgical consent, pre-op evaluation, timeout performed, IV checked, risks and benefits discussed and monitors and equipment checked Spinal Block Patient position: sitting Prep: DuraPrep Patient monitoring: heart rate, continuous pulse ox, blood pressure and cardiac monitor Approach: midline Location: L4-5 Injection technique: single-shot Needle Needle type: Introducer and Pencan  Needle gauge: 24 G Needle length: 9 cm Additional Notes Negative paresthesia. Negative blood return. Positive free-flowing CSF. Expiration date of kit checked and confirmed. Patient tolerated procedure well, without complications.

## 2018-04-05 NOTE — Progress Notes (Signed)
Spoke directly with Dr. Billy Coastaavon. Will arrive to see patient in about 5 minutes. OR aware. Keylon Labelle [place] Knoll[place] Rogan Wigley, [place] 04/05/2018 9:42 AM

## 2018-04-05 NOTE — Plan of Care (Signed)
  Problem: Education: Goal: Knowledge of General Education information will improve Outcome: Completed/Met  Admission to [facility] discussed.  Fall safety, baby safety, Plan of care board, how to reach RN, and plan of care for the next few hours reviewed.  Fall safety and baby safety documents signed.  Pt and significant other verbalized understanding of all teaching.

## 2018-04-05 NOTE — Progress Notes (Signed)
Dr. Billy Coastaavon called, left message that patient is ready for OR, and OR is ready for patient. Consent not yet signed.  Alaysha Jefcoat [place] Jaire Pinkham, [place] 04/05/2018 9:24 AM

## 2018-04-05 NOTE — Progress Notes (Signed)
Pt reported pain of 5/10 that was located in her lower right abdomen above the incision and felt sore.  The pt requested heat for the pain, ice was recommended but pt requested head so Kpad given.  Pt then reported, less than 30 minutes after the application of heat, that the pain was now a burning sensation and ice packs were given.  When pt reassessed 30 minutes later, she reported pain was now a stabbing pain that was about the size of a quarter in her lower right abdomen that got better with pressure.  Anesthesia notified and Dr. Hart RochesterHollis ordered another dose of fentanyl prn and 1000mg  tylenol prn.  Pt declined the fentanyl at this time because the previous doses of fentanyl didn't help decrease the pain.  Pt will take the tylenol once she is able to keep down crackers.  Pt expressed concern that the pain was a sign of "something going wrong."  On assessment, pt not noted to be inappropriately tender on palpation, fundus was firm and even with the umbilicus, no masses felt, no redness or bruising noted.  Marlinda Mikeanya Bailey CNM notified and she reported that based on the nurse's assessment it sounds like normal postoperative pain but to call her back if anything changes.  Pt also reporting itching and was reassured that itching was normal post operatviely and was a side effect of the medication, she felt that it was the beginning of hives and said that she felt that the only reason we couldn't see the hives is because she has had benadryl.  Nubain IV given and pt requested another dose of benadryl when she is able.  Marlinda Mikeanya Bailey CNM aware.  Will continue to monitor.

## 2018-04-06 DIAGNOSIS — D62 Acute posthemorrhagic anemia: Secondary | ICD-10-CM | POA: Diagnosis present

## 2018-04-06 LAB — CBC
HEMATOCRIT: 25.5 % — AB (ref 36.0–46.0)
HEMOGLOBIN: 8.2 g/dL — AB (ref 12.0–15.0)
MCH: 28.2 pg (ref 26.0–34.0)
MCHC: 32.2 g/dL (ref 30.0–36.0)
MCV: 87.6 fL (ref 78.0–100.0)
Platelets: 165 10*3/uL (ref 150–400)
RBC: 2.91 MIL/uL — ABNORMAL LOW (ref 3.87–5.11)
RDW: 23.7 % — AB (ref 11.5–15.5)
WBC: 12.8 10*3/uL — ABNORMAL HIGH (ref 4.0–10.5)

## 2018-04-06 MED ORDER — SODIUM CHLORIDE 0.9 % IV SOLN
510.0000 mg | Freq: Once | INTRAVENOUS | Status: AC
  Administered 2018-04-06: 510 mg via INTRAVENOUS
  Filled 2018-04-06: qty 17

## 2018-04-06 MED ORDER — MAGNESIUM OXIDE 400 (241.3 MG) MG PO TABS
400.0000 mg | ORAL_TABLET | Freq: Every day | ORAL | Status: DC
  Administered 2018-04-06 – 2018-04-08 (×3): 400 mg via ORAL
  Filled 2018-04-06 (×4): qty 1

## 2018-04-06 MED ORDER — FERROUS SULFATE 325 (65 FE) MG PO TABS
325.0000 mg | ORAL_TABLET | Freq: Two times a day (BID) | ORAL | Status: DC
  Administered 2018-04-06: 325 mg via ORAL
  Filled 2018-04-06: qty 1

## 2018-04-06 NOTE — Progress Notes (Signed)
POSTOPERATIVE DAY # 1 S/P Primary LTCS and BTL for Mono-Di twins, malpresentation Twin A Girl: Chelsea Allen Twin B Girl: Chelsea Allen   S:         Reports feeling very sore; weak, tired; near syncopal episode with ambulation to bathroom.  Reports significant pain on right side of abdomen - states last night the pain was moving around; feels "sharp and stabbing like a knife."  Has tried oral pain medications and Motrin without relief.  Currently using an ice pack to right abdomen, which has improved the pain some.              Tolerating po intake / no nausea / no vomiting / no flatus / no BM  Denies dizziness, SOB, or CP             Bleeding is moderate             Up with assistance / has voided x 1 since foley removal   Newborns are breast feeding with formula supplementation.  Has been using the nipple shield due to pain with latching.  Reports breastfeeding experience and used nipple shield in the past.    O:  VS: BP 93/66 (BP Location: Left Arm)   Pulse 62   Temp 99.3 F (37.4 C) (Oral)   Resp 16   Ht 5\' 4"  (1.626 m)   Wt 76.7 kg (169 lb)   SpO2 96%   Breastfeeding? Unknown   BMI 29.01 kg/m    LABS:               Recent Labs    04/04/18 1101 04/06/18 0524  WBC 9.0 12.8*  HGB 11.0* 8.2*  PLT 191 165               Bloodtype: --/--/O POS (05/31 1101)  Rubella: Immune (11/21 0000)                                             I&O: Intake/Output      06/01 0701 - 06/02 0700 06/02 0701 - 06/03 0700   I.V. (mL/kg) 1500 (19.6)    Total Intake(mL/kg) 1500 (19.6)    Urine (mL/kg/hr) 1025    Blood 1068    Total Output 2093    Net -593                      Physical Exam:             Alert and Oriented X3  Lungs: Clear and unlabored  Heart: regular rate and rhythm / no murmurs  Abdomen: soft, distention and tenderness noted in RLQ - not near incision; active bowel sounds in all quadrants             Fundus: firm, non-tender, U-1             Dressing: pressure dressing in place             Incision:  approximated with sutures unable to visualize due to pressure dressing; attempted to palpate through dressing and appropriate tenderness   Perineum: intact   Lochia: small, no clots   Extremities: trace pedal edema, no calf pain or tenderness,   A:        POD # 1 S/P Primary LTCS and BTL  ABL Anemia   RLQ pain and abdominal distention - likely flatus etiology due to location  P:        Routine postoperative care   IV Feraheme x 1; then resume Ferrous sulfate 325mg  BID  Magnesium oxide 400mg  daily   Encouraged warm liquids, NO straws, ambulation, alternating with heating pad and ice pack   Pain regimen discussed  See lactation for twins   Discussed shower later this afternoon and removing pressure dressing   Notify for worsening s/s of RLQ pain, fever, chills  Continue current care    Carlean JewsMeredith Railee Bonillas, MSN, CNM [facility]

## 2018-04-06 NOTE — Progress Notes (Signed)
CSW received consult for patient due to history of depression. CSW met with patient, husband, and both twins at bedside to complete consult. CSW spoke with patient regarding diagnosis of depression, patient stated that was in the past and that she has not experienced any symptoms of depression within recent years. Patient has two older children ages 70 and 38, so having the twins has been overwhelming. Patient reports having a great support system from her husband and family. Patient feels comfortable reaching out to her OBGYN or PCP if symptoms become present. CSW educated patient on baby blues period versus postpartum depression. CSW encouraged patient to reach out for assistance if needs arise.  Madilyn Fireman, MSW, [place] Social Worker [facility] [phone]

## 2018-04-06 NOTE — Lactation Note (Signed)
This note was copied from a baby's chart. Lactation Consultation Note  Patient Name: Leonette MonarchGirlA Leslye Deriso ZOXWR'UToday's Date: 04/06/2018    [facility]  Family sleeping and babies in bassinet together.  Checked with RN and the babies have been feeding well.  It is not time to awaken them yet.  Will have day shift LC follow up.           Lilinoe Acklin R Kree Armato 04/06/2018, 6:12 AM

## 2018-04-06 NOTE — Lactation Note (Addendum)
This note was copied from a baby's chart. Lactation Consultation Note  Patient Name: Chelsea Allen WUJWJ'XToday's Date: 04/06/2018 Reason for consult: Initial assessment;Infant < 6lbs;Multiple gestation;Late-preterm 34-36.6wks  26 hours old late preterm twins girls who are being partially BF and also formula fed by their mother; babies are on Similac 22 calorie formula. Mom is a P3 and experienced BF, she was able to BF her first child for 9 months and her second one for 7 months. Mom experienced some BF challenges in the past with nipple pain, her nipples would crack and get sore.   Baby A  Baby was already nursing in cross cradle position on the left breast with a ns # 20 when entering the room, a few swallows were heard. Per parents this baby like the bottle better than the breast, and they just keep supplementing with formula. Per mom left nipple is already cracked, when baby finished nursing noticed some colostrum on NS. Mom reports this is the baby that has more challenges with regarding BF.  Baby B  Baby was swaddled and asleep on visitor's arms when entering the room, per parents this baby likes more to BF than the bottle. Mom reports no issues with this baby regarding BF.  Mom  Mom has been feeding both babies with a NS # 20 but she hasn't been set up with a DEBP. Offered to set one up for her but she declined. Explained to mom the importance of consistent pumping once she's on a NS, mom verbalized understanding and she said she'd pump once she gets home, she has a Medela DEBP she just doesn't want to do it here at the hospital. Reviewed treatment for sore nipples; comfort gels were given, reviewed instructions, cleaning and storage, mom is aware that she's not supposed to wear them together with her nipple cream (she brought her own, an olive oil based).  Encouraged mom to feed babies STS 8-12 times/24 hours or sooner if feeding cues are present. If babies are not cueing in a 3 hour  period, mom will place them STS to the breast to give them an opportunity to feed. Supplementation will follow after feedings at the breast, mom will start pumping once she gets home and use her EBM to supplement babies and complete the remaining volume with formula. Formula supplementation guidelines for late pre-terms were also reviewed. Mom will wear comfort gels in between feedings. BF brochure, BF resources and feeding diary were also discusses. Mom is aware of LC services and will call PRN.   Maternal Data Formula Feeding for Exclusion: No Has patient been taught Hand Expression?: Yes Does the patient have breastfeeding experience prior to this delivery?: Yes  Feeding Feeding Type: Breast Fed  LATCH Score Latch: Grasps breast easily, tongue down, lips flanged, rhythmical sucking.  Audible Swallowing: A few with stimulation  Type of Nipple: Everted at rest and after stimulation  Comfort (Breast/Nipple): Filling, red/small blisters or bruises, mild/mod discomfort  Hold (Positioning): Assistance needed to correctly position infant at breast and maintain latch.  LATCH Score: 7  Interventions Interventions: Breast feeding basics reviewed;Assisted with latch;Breast massage;Breast compression  Lactation Tools Discussed/Used Tools: Nipple Shields Nipple shield size: 20 WIC Program: No   Consult Status Consult Status: Follow-up Date: 04/07/18 Follow-up type: In-patient    Roizy Harold Venetia ConstableS Lulani Bour 04/06/2018, 1:18 PM

## 2018-04-07 ENCOUNTER — Encounter (HOSPITAL_COMMUNITY): Payer: Self-pay | Admitting: *Deleted

## 2018-04-07 MED ORDER — POLYSACCHARIDE IRON COMPLEX 150 MG PO CAPS
150.0000 mg | ORAL_CAPSULE | Freq: Every day | ORAL | Status: DC
  Administered 2018-04-07 – 2018-04-08 (×2): 150 mg via ORAL
  Filled 2018-04-07 (×2): qty 1

## 2018-04-07 MED ORDER — ASPIRIN EC 81 MG PO TBEC
81.0000 mg | DELAYED_RELEASE_TABLET | Freq: Every day | ORAL | Status: DC
  Administered 2018-04-07 – 2018-04-08 (×2): 81 mg via ORAL
  Filled 2018-04-07 (×3): qty 1

## 2018-04-07 NOTE — Lactation Note (Signed)
This note was copied from a baby's chart. Lactation Consultation Note  Patient Name: Chelsea Allen Chelsea Allen ZOXWR'UToday's Date: 04/07/2018 Reason for consult: Follow-up assessment   Mother called for LC to view latch on Baby B. Intermittent sucks and swallows observed. Discussed LPI feeding plan and to increase supplementation. Baby is latched w/ #20 nipple shield. LC will follow up when mother calls to view Baby A feeding.   Maternal Data    Feeding Feeding Type: Breast Fed  LATCH Score Latch: Grasps breast easily, tongue down, lips flanged, rhythmical sucking.  Audible Swallowing: A few with stimulation  Type of Nipple: Everted at rest and after stimulation  Comfort (Breast/Nipple): Filling, red/small blisters or bruises, mild/mod discomfort  Hold (Positioning): No assistance needed to correctly position infant at breast.  LATCH Score: 8  Interventions    Lactation Tools Discussed/Used     Consult Status Consult Status: Follow-up Date: 04/08/18 Follow-up type: In-patient    [name], Ruth [facility] 04/07/2018, 9:55 AM

## 2018-04-07 NOTE — Lactation Note (Addendum)
This note was copied from a baby's chart. Lactation Consultation Note  Patient Name: Chelsea Allen ZOXWR'UToday's Date: 04/07/2018   Twins 46 hours old and sleeping.  Mom has my # to call for assist w/next feeding. Reviewed LPI supplemental guidelines encouraging mother to supplement after each feeding and increase volume per day of life. Mother is using #20NS.  Mother does not want to pump until she gets home.   Mother states her nipple soreness is improving.  She has personal nipple cream and shells.  She states she does not like comfort gels.     Maternal Data    Feeding Feeding Type: Bottle Fed - Formula  LATCH Score                   Interventions    Lactation Tools Discussed/Used     Consult Status      Hardie PulleyBerkelhammer, Zuzu Befort Boschen 04/07/2018, 8:47 AM

## 2018-04-07 NOTE — Lactation Note (Addendum)
This note was copied from a baby's chart. Lactation Consultation Note  Patient Name: Chelsea Allen WUJWJ'XToday's Date: 04/07/2018   Baby 52 hours old and latched upon entering using nipple shield due to soreness. She has personal nipple cream.  Discussed APNO since mother's has abrasions, bleeding on tips. Mother pulling back nipple shield.  Explained this can cause shallow latch and soreness. Intermittent sucks and swallows observed.  Praised mother for her efforts. Discussed massaging breasts to empty during feeding. Discussed late preterm feeding plan and limiting feeding time at breast to allow energy for supplementation. Parents have been supplementing w/ formula increasing per day of life. Mom encouraged to feed baby 8-12 times/24 hours and with feeding cues at least q 3 hours.       Maternal Data    Feeding    LATCH Score                   Interventions    Lactation Tools Discussed/Used     Consult Status      Hardie PulleyBerkelhammer, Ruth Boschen 04/07/2018, 3:03 PM

## 2018-04-07 NOTE — Lactation Note (Signed)
This note was copied from a baby's chart. Lactation Consultation Note  Patient Name: Chelsea MonarchGirlA Chelsea Allen's Date: 04/07/2018 Reason for consult: Follow-up assessment;Late-preterm 34-36.6wks;Infant < 6lbs   Observed feeding Baby A.  Baby becoming sleepy at the breast after approx 10 min. Intermittent sucks and swallows observed.  Mother using #20NS. L breast is filling.  Suggest mother start pumping with either DEBP or manual or at least hand express to give relief. Mother refused.  Suggest taking off nipple shield to try latch but mother declined.    Maternal Data    Feeding Feeding Type: Breast Fed Length of feed: 10 min  LATCH Score Latch: Grasps breast easily, tongue down, lips flanged, rhythmical sucking.  Audible Swallowing: A few with stimulation  Type of Nipple: Everted at rest and after stimulation  Comfort (Breast/Nipple): Engorged, cracked, bleeding, large blisters, severe discomfort  Hold (Positioning): No assistance needed to correctly position infant at breast.  LATCH Score: 7  Interventions Interventions: Shells(personal nipple cream)  Lactation Tools Discussed/Used Tools: Nipple Shields Nipple shield size: 20   Consult Status Consult Status: Follow-up Date: 04/08/18 Follow-up type: In-patient    [name], Ruth [facility] 04/07/2018, 10:42 AM

## 2018-04-07 NOTE — Progress Notes (Signed)
POSTOPERATIVE DAY # 2 S/P CS-primary with twins  S:         Reports feeling sharp pain still on right but less             Tolerating po intake / no nausea / no vomiting / + flatus / no BM             Bleeding is light             Pain controlled with Motrin and Oxycodone             Up ad lib / ambulatory with episode of dizziness after shower yesterday/ voiding QS  Newborn Bottle, Breast and formula supplementation / twin GIRLS  O:  VS: BP 111/70 (BP Location: Left Arm)   Pulse 63   Temp 98.1 F (36.7 C) (Oral)   Resp 16   Ht 5\' 4"  (1.626 m)   Wt 76.7 kg (169 lb)   SpO2 98%   Breastfeeding? Unknown   BMI 29.01 kg/m   LABS:              Recent Labs    04/04/18 1101 04/06/18 0524  WBC 9.0 12.8*  HGB 11.0* 8.2*  PLT 191 165   Bloodtype: --/--/O POS (05/31 1101)  Rubella: Immune (11/21 0000)                                    Physical Exam:             Alert and Oriented X3  Lungs: Clear and unlabored  Heart: regular rate and rhythm / no mumurs  Abdomen: soft, non-tender, non-distended              Fundus: firm, non-tender, Ueven             Dressing intact honeycomb              Incision:  approximated with suture / no erythema / no ecchymosis / no drainage  Perineum: intact  Lochia: light  Extremities: no edema, no calf pain or tenderness, negative Homans  A:        POD # 2 S/P CS            Twins             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              ABL anemia - stable  P:        Routine postoperative care - anticipate DC tomorrow (may need room-in if twins not DC)                   - reviewed pain related to surgery - fascial pain / gas pain / tubal pain all reviewed                   - reassured normal and will improve over next week - better each day                   - warm fluids / soups  and Kpad to abdomen for comfort and warm shower water             Re-start daily ASA / ambulate to reduce clot risk PP             Anticipatory care at  home post-op / activity /  care of twins              Take iron and magnesium x6 weeks - maintain good water intake with anemia & BF   Chelsea Allen CNM, MSN, [facility] HospitalFACNM 04/07/2018, 10:17 AM

## 2018-04-08 MED ORDER — IBUPROFEN 800 MG PO TABS: 800.0000 mg | ORAL_TABLET | Freq: Three times a day (TID) | ORAL | 0 refills | Status: AC | PRN

## 2018-04-08 MED ORDER — MAGNESIUM OXIDE 400 (241.3 MG) MG PO TABS: 400.0000 mg | ORAL_TABLET | Freq: Every day | ORAL | 1 refills | Status: DC

## 2018-04-08 MED ORDER — POLYSACCHARIDE IRON COMPLEX 150 MG PO CAPS: 150.0000 mg | ORAL_CAPSULE | Freq: Every day | ORAL | 0 refills | Status: DC

## 2018-04-08 MED ORDER — OXYCODONE-ACETAMINOPHEN 5-325 MG PO TABS: 1.0000 | ORAL_TABLET | ORAL | 0 refills | Status: DC | PRN

## 2018-04-08 NOTE — Discharge Summary (Signed)
OB Discharge Summary  Patient Name: Chelsea Allen DOB: 21-Jan-1981 MRN: [id number]  Date of admission: 04/05/2018  Admitting diagnosis: Mono-Di Twins, Breech Twin A Intrauterine pregnancy: [redacted]w[redacted]d     Secondary diagnosis: Genetic Thrombophilia   Date of discharge: 04/08/2018    Discharge diagnosis: Term Pregnancy Delivered, Anemia and POD 3 s/p primary CS and genetic thrombophilia -      Prenatal history: [id number]   EDC : 04/28/2018, by Other Basis  Prenatal care at [facility]  Primary provider : [facility]on Prenatal course complicated by [facility] / genetic thrombophilia / twins (Mo-Di)  Prenatal Labs: ABO, Rh: --/--/O POS (05/31 1101)  Antibody: NEG (05/31 1101) Rubella: Immune (11/21 0000)   RPR: Non Reactive (05/31 1101)  HBsAg: Negative (11/21 0000)  HIV: Non-reactive (11/21 0000)                                  Hospital course:  Sceduled C/S   37 y.o. yo [id number] at [redacted]w[redacted]d was admitted to the hospital 04/05/2018 for scheduled cesarean section with the following indication:Malpresentation and Multifetal Gestation.  Membrane Rupture Time/Date:    Jarielys, Girardot [[id number]]  10:29 AM   Dorathy Daft Eaton [[id number]]  10:32 AM ,   Jalise, Zawistowski [[id number]]  04/05/2018   Marigene, Erler [[id number]]  04/05/2018   Patient delivered a Viable infant girls x 2   Emma-Lee, Oddo [[id number]]  04/05/2018   Paelyn, Smick [[id number]]  04/05/2018  Details of operation can be found in separate operative note.  Pateint had an uncomplicated postpartum course.  She is ambulating, tolerating a regular diet, passing flatus, and urinating well. Patient is discharged home in stable condition on  04/08/18         Delivering PROVIDER:    Antionette, Luster [[id number]]  Latifah, Padin Penngrove [[id number]]  Olivia Mackie                                                            Complications:  None  Newborn Data:    Magdalena, Skilton [[id number]]  Live born female  Birth Weight: 5 lb 11.4 oz (2591 g) APGAR: 9, 9  Newborn Delivery   Birth date/time:  04/05/2018 10:32:00 Delivery type:  C-Section, Low Transverse Trial of labor:  No C-section categorization:  Primary      Marialy, Urbanczyk [[id number]]  Live born female  Birth Weight: 6 lb 9.3 oz (2985 g) APGAR: 8, 9  Newborn Delivery   Birth date/time:  04/05/2018 10:33:00 Delivery type:  C-Section, Low Transverse Trial of labor:  No C-section categorization:  Primary     Baby Feeding: Bottle, Breast and formula supplementation Disposition:rooming in  Post partum procedures:none  Labs: Lab Results  Component Value Date   WBC 12.8 (H) 04/06/2018   HGB 8.2 (L) 04/06/2018   HCT 25.5 (L) 04/06/2018   MCV 87.6 04/06/2018   PLT 165 04/06/2018   CMP Latest Ref Rng & Units 03/18/2012  Potassium 3.5 - 5.1 mEq/L 4.0      Physical Exam @ time of discharge:  Vitals:   04/06/18 1800 04/07/18 0507 04/07/18 1927 04/08/18 0543  BP: 106/73  111/70 113/76 (!) 114/50  Pulse: 64 63 78 68  Resp: 18 16 18    Temp: 98.2 F (36.8 C) 98.1 F (36.7 C) 98.6 F (37 C) 98.8 F (37.1 C)  TempSrc:  Oral Oral   SpO2:   98% 97%  Weight:      Height:        General: alert, cooperative and no distress Lochia: appropriate Uterine Fundus: firm Perineum: intact Incision: Healing well with no significant drainage Extremities: DVT Evaluation: No evidence of DVT seen on physical exam.   Discharge instructions:  "Baby and Me Booklet" and [facility] Booklet  Discharge Medications:  Allergies as of 04/08/2018      Reactions   Beef-derived Products Hives   Lambs Quarters Hives   Pork-derived Products Hives   Sulfonamide Derivatives Hives      Medication List    TAKE these medications   aspirin EC 81 MG tablet Take 81 mg by mouth daily.   doxylamine (Sleep) 25 MG tablet Commonly known as:  UNISOM Take 25 mg by  mouth at bedtime.   ibuprofen 800 MG tablet Commonly known as:  ADVIL,MOTRIN Take 1 tablet (800 mg total) by mouth every 8 (eight) hours as needed.   iron polysaccharides 150 MG capsule Commonly known as:  NIFEREX Take 1 capsule (150 mg total) by mouth daily.   magnesium oxide 400 (241.3 Mg) MG tablet Commonly known as:  MAG-OX Take 1 tablet (400 mg total) by mouth daily.   multivitamin with minerals Tabs tablet Take 1 tablet by mouth daily.   oxyCODONE-acetaminophen 5-325 MG tablet Commonly known as:  PERCOCET/ROXICET Take 1 tablet by mouth every 4 (four) hours as needed (pain scale 4-7).            Discharge Care Instructions  (From admission, onward)        Start     Ordered   04/08/18 0000  Discharge wound care:    Comments:  Keep incision clean and dry / Remove plastic honeycomb -Saturday (this week) and steri-strips NEXT Saturday. Dry incision - pat dry and dry with hairdryer on low   04/08/18 1014      Diet: routine diet  Activity: Advance as tolerated. Pelvic rest x 6 weeks.   Follow up:2 weeks    Signed: Marlinda Mikeanya Jaidev Sanger CNM, MSN, [facility] 04/08/2018, 10:15 AM

## 2018-04-08 NOTE — Progress Notes (Signed)
POSTOPERATIVE DAY # 3 S/P CS - malpresentation of twins  S:         Reports feeling tired / really sore with movement             Tolerating po intake / no nausea / no vomiting / + flatus / + BM             Bleeding is light             Pain controlled with Motrin / Oxycodone / Tylenol             Up ad lib / ambulatory/ voiding QS  Newborn Bottle, Breast and formula supplementation   O:  VS: BP (!) 114/50 (BP Location: Right Arm)   Pulse 68   Temp 98.8 F (37.1 C)   Resp 18   Ht 5\' 4"  (1.626 m)   Wt 76.7 kg (169 lb)   SpO2 97%   Breastfeeding? Unknown   BMI 29.01 kg/m              Physical Exam:             Alert and Oriented X3  Lungs: Clear and unlabored  Heart: regular rate and rhythm / no mumurs  Abdomen: soft, non-tender, non-distended              Fundus: firm, non-tender, U-1             Dressing intact              Incision:  approximated with suture / no erythema / no ecchymosis / no drainage  Perineum: intact  Lochia: light  Extremities: trace edema, no calf pain or tenderness, negative Homans  A:        POD # 3 S/P CS            ABL anemia            Genetic thrombophilia w/out hx thromboembolus event  P:        Routine postoperative care              Baby ASA daily             Iron and magnesium               Reviewed care for DC / rooming-in until babies DC by Peds              Post-op care / activity restrictions / incision care               OV recommended in 2 weeks - encouraged BF and PP support group                   support for families with multiples     Marlinda Mikeanya Bailey CNM, MSN, [facility] 04/08/2018, 10:06 AM

## 2018-04-08 NOTE — Lactation Note (Addendum)
This note was copied from a baby's chart. Lactation Consultation Note  Patient Name: Chelsea Allen [name] NGEXB'MToday's Date: 04/08/2018   Twins 7470 hours old.  Mother states she tried latching without nipple shield but is too sore. She has coconut oil and is wearing shells. She is using #20NS and states her breasts are filling. Suggest she limit breastfeeding sessions to 15 min and then supplement w/ formula increasing amount after each feeding. Encouraged her to post pump to empty breasts and establish her milk supply. So far mother has not pumped but may be willing to try today.  Observed feeding on baby B on R breast with #20NS.  Intermittent sucks and swallows. Baby breastfed for 15 min, then FOB supplemented with formula. Set up DEBP. Recommend mother post pump 15 min massaging breasts to empty. Mother concerned she may get engorged.  Explained it's important to fully empty breasts. She will try and pump again later today when her breasts feel full again. Explained that with twins her body will make a large amount of breastmilk. When mother took off nipple shield, breastmilk in shield visible.      Maternal Data    Feeding    LATCH Score                   Interventions    Lactation Tools Discussed/Used     Consult Status      [name], Ruth [facility] 04/08/2018, 9:05 AM

## 2018-04-09 ENCOUNTER — Ambulatory Visit: Payer: Self-pay

## 2018-04-09 NOTE — Lactation Note (Signed)
This note was copied from a baby's chart. Lactation Consultation Note:  Twins are 7194 hours old. Mother is breast and bottle feeding infants. Baby A is -5 % weight loss and B is -9 % weight loss. Mother reports that both infants are breastfeeding well. Mother is using a nipple shield and is seeing milk in the shield. Mothers breast are filling. She has a small crack on the left nipple that is healing.  Mother wants to be discharged today. She has an appt with her Peds tomorrow as well as the LC in the Peds office.  Mother advised to page [facility] for [facility] check with infants next feeding. Advised mother to use a wide base bottle nipple when she gets home and to pace bottle feed. Mother has supplemental guidelines. Encouraged frequent skin to skin and continued cue base feeding. Mother to breastfeed at least 8-12 times in 24 hours and supplement after breastfeeding.  Discussed treatment for sorenipples. Mother has comfort gels but prefers not to use. She has her own nipple cream. Advised mother to hand express colostrum after each feeding . Mother has a DEBP at home as well as a manual pump. Discussed treatment and prevention of engorgement. Discussed S/S of Mastitis. Mother denies concerns or questions about breastfeeding. Mother is aware of available LC services .  Mother receptive to all teaching.    Patient Name: Chelsea MonarchGirlA Kimmie Cropper IHKVQ'QToday's Date: 04/09/2018 Reason for consult: Follow-up assessment   Maternal Data    Feeding    LATCH Score                   Interventions Interventions: Skin to skin;Breast massage;Hand express;Pre-pump if needed;Expressed milk;Shells;Hand pump;DEBP  Lactation Tools Discussed/Used     Consult Status Consult Status: Follow-up Date: 04/09/18 Follow-up type: In-patient    Chelsea Allen, Chelsea Allen 04/09/2018, 9:57 AM

## 2018-12-02 ENCOUNTER — Other Ambulatory Visit: Payer: Self-pay

## 2018-12-02 ENCOUNTER — Emergency Department: Payer: BC Managed Care – PPO

## 2018-12-02 ENCOUNTER — Observation Stay: Payer: BC Managed Care – PPO | Admitting: Anesthesiology

## 2018-12-02 ENCOUNTER — Observation Stay
Admission: EM | Admit: 2018-12-02 | Discharge: 2018-12-03 | Disposition: A | Discharge status: Home / Self Care | Payer: BC Managed Care – PPO | Attending: Surgery | Admitting: Surgery

## 2018-12-02 ENCOUNTER — Encounter
Admission: EM | Disposition: A | Discharge status: Home / Self Care | Payer: Self-pay | Source: Home / Self Care | Attending: Emergency Medicine

## 2018-12-02 ENCOUNTER — Encounter: Payer: Self-pay | Admitting: Emergency Medicine

## 2018-12-02 DIAGNOSIS — Z91018 Allergy to other foods: Secondary | ICD-10-CM | POA: Diagnosis not present

## 2018-12-02 DIAGNOSIS — Z882 Allergy status to sulfonamides status: Secondary | ICD-10-CM | POA: Diagnosis not present

## 2018-12-02 DIAGNOSIS — I341 Nonrheumatic mitral (valve) prolapse: Secondary | ICD-10-CM | POA: Diagnosis not present

## 2018-12-02 DIAGNOSIS — Z793 Long term (current) use of hormonal contraceptives: Secondary | ICD-10-CM | POA: Insufficient documentation

## 2018-12-02 DIAGNOSIS — K429 Umbilical hernia without obstruction or gangrene: Secondary | ICD-10-CM | POA: Insufficient documentation

## 2018-12-02 DIAGNOSIS — K3580 Unspecified acute appendicitis: Principal | ICD-10-CM | POA: Diagnosis present

## 2018-12-02 DIAGNOSIS — D6852 Prothrombin gene mutation: Secondary | ICD-10-CM | POA: Diagnosis not present

## 2018-12-02 HISTORY — PX: LAPAROSCOPIC APPENDECTOMY: SHX408

## 2018-12-02 HISTORY — PX: UMBILICAL HERNIA REPAIR: SHX196

## 2018-12-02 LAB — COMPREHENSIVE METABOLIC PANEL
ALT: 12 U/L (ref 0–44)
ANION GAP: 8 (ref 5–15)
AST: 15 U/L (ref 15–41)
Albumin: 4.2 g/dL (ref 3.5–5.0)
Alkaline Phosphatase: 48 U/L (ref 38–126)
BILIRUBIN TOTAL: 0.9 mg/dL (ref 0.3–1.2)
BUN: 17 mg/dL (ref 6–20)
CALCIUM: 9.2 mg/dL (ref 8.9–10.3)
CO2: 23 mmol/L (ref 22–32)
CREATININE: 0.68 mg/dL (ref 0.44–1.00)
Chloride: 105 mmol/L (ref 98–111)
Glucose, Bld: 107 mg/dL — ABNORMAL HIGH (ref 70–99)
Potassium: 3.4 mmol/L — ABNORMAL LOW (ref 3.5–5.1)
SODIUM: 136 mmol/L (ref 135–145)
TOTAL PROTEIN: 7.2 g/dL (ref 6.5–8.1)

## 2018-12-02 LAB — URINALYSIS, COMPLETE (UACMP) WITH MICROSCOPIC
Bacteria, UA: NONE SEEN
Bilirubin Urine: NEGATIVE
GLUCOSE, UA: NEGATIVE mg/dL
HGB URINE DIPSTICK: NEGATIVE
Ketones, ur: 5 mg/dL — AB
Nitrite: NEGATIVE
PH: 5 (ref 5.0–8.0)
PROTEIN: NEGATIVE mg/dL
Specific Gravity, Urine: >1.046 — ABNORMAL HIGH (ref 1.005–1.030)

## 2018-12-02 LAB — CBC
HCT: 36.2 % (ref 36.0–46.0)
Hemoglobin: 12.1 g/dL (ref 12.0–15.0)
MCH: 29.7 pg (ref 26.0–34.0)
MCHC: 33.4 g/dL (ref 30.0–36.0)
MCV: 88.9 fL (ref 80.0–100.0)
NRBC: 0 % (ref 0.0–0.2)
PLATELETS: 271 10*3/uL (ref 150–400)
RBC: 4.07 MIL/uL (ref 3.87–5.11)
RDW: 11.8 % (ref 11.5–15.5)
WBC: 10.7 10*3/uL — ABNORMAL HIGH (ref 4.0–10.5)

## 2018-12-02 LAB — I-STAT BETA HCG BLOOD, ED (NOT ORDERABLE): I-stat hCG, quantitative: <5 m[IU]/mL (ref <5)

## 2018-12-02 LAB — LIPASE, BLOOD: Lipase: 26 U/L (ref 11–51)

## 2018-12-02 LAB — SURGICAL PCR SCREEN
MRSA, PCR: NEGATIVE
Staphylococcus aureus: NEGATIVE

## 2018-12-02 SURGERY — APPENDECTOMY, LAPAROSCOPIC
Anesthesia: General

## 2018-12-02 MED ORDER — FENTANYL CITRATE (PF) 100 MCG/2ML IJ SOLN
INTRAMUSCULAR | Status: AC
  Filled 2018-12-02: qty 2

## 2018-12-02 MED ORDER — HYDROMORPHONE HCL 1 MG/ML IJ SOLN
0.5000 mg | INTRAMUSCULAR | Status: AC | PRN
  Administered 2018-12-02 (×4): 0.5 mg via INTRAVENOUS

## 2018-12-02 MED ORDER — ROCURONIUM BROMIDE 100 MG/10ML IV SOLN
INTRAVENOUS | Status: DC | PRN
  Administered 2018-12-02: 40 mg via INTRAVENOUS

## 2018-12-02 MED ORDER — SEVOFLURANE IN SOLN
RESPIRATORY_TRACT | Status: AC
  Filled 2018-12-02: qty 250

## 2018-12-02 MED ORDER — FENTANYL CITRATE (PF) 100 MCG/2ML IJ SOLN
INTRAMUSCULAR | Status: AC
  Administered 2018-12-02: 25 ug via INTRAVENOUS
  Filled 2018-12-02: qty 2

## 2018-12-02 MED ORDER — ONDANSETRON HCL 4 MG/2ML IJ SOLN: 4.0000 mg | Freq: Once | INTRAMUSCULAR | Status: DC | PRN

## 2018-12-02 MED ORDER — OXYCODONE HCL 5 MG PO TABS: 5.0000 mg | ORAL_TABLET | ORAL | Status: DC | PRN

## 2018-12-02 MED ORDER — GABAPENTIN 400 MG PO CAPS
400.0000 mg | ORAL_CAPSULE | Freq: Once | ORAL | Status: AC
  Administered 2018-12-02: 400 mg via ORAL

## 2018-12-02 MED ORDER — MORPHINE SULFATE (PF) 2 MG/ML IV SOLN
2.0000 mg | Freq: Once | INTRAVENOUS | Status: AC
  Administered 2018-12-02: 2 mg via INTRAVENOUS

## 2018-12-02 MED ORDER — LIDOCAINE HCL (CARDIAC) PF 100 MG/5ML IV SOSY
PREFILLED_SYRINGE | INTRAVENOUS | Status: DC | PRN
  Administered 2018-12-02: 80 mg via INTRAVENOUS

## 2018-12-02 MED ORDER — HYDROMORPHONE HCL 1 MG/ML IJ SOLN
INTRAMUSCULAR | Status: AC
  Administered 2018-12-02: 0.5 mg
  Filled 2018-12-02: qty 1

## 2018-12-02 MED ORDER — HYDROMORPHONE HCL 1 MG/ML PO LIQD: 1.0000 mg | Freq: Once | ORAL | Status: DC

## 2018-12-02 MED ORDER — ONDANSETRON HCL 4 MG/2ML IJ SOLN
INTRAMUSCULAR | Status: DC | PRN
  Administered 2018-12-02: 4 mg via INTRAVENOUS

## 2018-12-02 MED ORDER — IOPAMIDOL (ISOVUE-300) INJECTION 61%
100.0000 mL | Freq: Once | INTRAVENOUS | Status: AC | PRN
  Administered 2018-12-02: 100 mL via INTRAVENOUS

## 2018-12-02 MED ORDER — HYDROMORPHONE HCL 1 MG/ML IJ SOLN
1.0000 mg | Freq: Once | INTRAMUSCULAR | Status: AC
  Administered 2018-12-02: 1 mg via INTRAVENOUS
  Filled 2018-12-02: qty 1

## 2018-12-02 MED ORDER — HYDROMORPHONE HCL 1 MG/ML IJ SOLN: 0.5000 mg | Freq: Once | INTRAMUSCULAR | Status: DC

## 2018-12-02 MED ORDER — ONDANSETRON 4 MG PO TBDP: 4.0000 mg | ORAL_TABLET | Freq: Four times a day (QID) | ORAL | Status: DC | PRN

## 2018-12-02 MED ORDER — SCOPOLAMINE 1 MG/3DAYS TD PT72
1.0000 | MEDICATED_PATCH | Freq: Once | TRANSDERMAL | Status: DC
  Administered 2018-12-02: 1.5 mg via TRANSDERMAL
  Filled 2018-12-02: qty 1

## 2018-12-02 MED ORDER — FENTANYL CITRATE (PF) 100 MCG/2ML IJ SOLN
INTRAMUSCULAR | Status: DC | PRN
  Administered 2018-12-02: 50 ug via INTRAVENOUS

## 2018-12-02 MED ORDER — MORPHINE SULFATE (PF) 2 MG/ML IV SOLN
INTRAVENOUS | Status: AC
  Filled 2018-12-02: qty 1

## 2018-12-02 MED ORDER — MIDAZOLAM HCL 2 MG/2ML IJ SOLN
INTRAMUSCULAR | Status: DC | PRN
  Administered 2018-12-02: 2 mg via INTRAVENOUS

## 2018-12-02 MED ORDER — SODIUM CHLORIDE 0.9% FLUSH: 3.0000 mL | Freq: Once | INTRAVENOUS | Status: DC

## 2018-12-02 MED ORDER — DIPHENHYDRAMINE HCL 50 MG/ML IJ SOLN
INTRAMUSCULAR | Status: AC
  Administered 2018-12-02: 25 mg via INTRAVENOUS
  Filled 2018-12-02: qty 1

## 2018-12-02 MED ORDER — GABAPENTIN 400 MG PO CAPS
ORAL_CAPSULE | ORAL | Status: AC
  Administered 2018-12-02: 400 mg via ORAL
  Filled 2018-12-02: qty 1

## 2018-12-02 MED ORDER — FENTANYL CITRATE (PF) 100 MCG/2ML IJ SOLN
25.0000 ug | INTRAMUSCULAR | Status: DC | PRN
  Administered 2018-12-02 (×4): 25 ug via INTRAVENOUS

## 2018-12-02 MED ORDER — SCOPOLAMINE 1 MG/3DAYS TD PT72
MEDICATED_PATCH | TRANSDERMAL | Status: AC
  Filled 2018-12-02: qty 1

## 2018-12-02 MED ORDER — DIPHENHYDRAMINE HCL 50 MG/ML IJ SOLN
25.0000 mg | Freq: Once | INTRAMUSCULAR | Status: AC
  Administered 2018-12-02: 25 mg via INTRAVENOUS
  Filled 2018-12-02: qty 1

## 2018-12-02 MED ORDER — MORPHINE SULFATE (PF) 4 MG/ML IV SOLN
INTRAVENOUS | Status: AC
  Filled 2018-12-02: qty 1

## 2018-12-02 MED ORDER — SODIUM CHLORIDE 0.9 % IV SOLN
INTRAVENOUS | Status: DC | PRN
  Administered 2018-12-02 (×2): 250 mL via INTRAVENOUS

## 2018-12-02 MED ORDER — LACTATED RINGERS IV SOLN
INTRAVENOUS | Status: DC
  Administered 2018-12-02 – 2018-12-03 (×4): via INTRAVENOUS

## 2018-12-02 MED ORDER — PIPERACILLIN-TAZOBACTAM 3.375 G IVPB
3.3750 g | Freq: Three times a day (TID) | INTRAVENOUS | Status: DC
  Administered 2018-12-02 – 2018-12-03 (×3): 3.375 g via INTRAVENOUS
  Filled 2018-12-02 (×3): qty 50

## 2018-12-02 MED ORDER — SUGAMMADEX SODIUM 200 MG/2ML IV SOLN
INTRAVENOUS | Status: AC
  Filled 2018-12-02: qty 2

## 2018-12-02 MED ORDER — MUPIROCIN 2 % EX OINT
1.0000 "application " | TOPICAL_OINTMENT | Freq: Two times a day (BID) | CUTANEOUS | Status: DC
  Filled 2018-12-02: qty 22

## 2018-12-02 MED ORDER — HYDROMORPHONE HCL 1 MG/ML IJ SOLN: 1.0000 mg | INTRAMUSCULAR | Status: DC | PRN

## 2018-12-02 MED ORDER — KETOROLAC TROMETHAMINE 30 MG/ML IJ SOLN
30.0000 mg | Freq: Four times a day (QID) | INTRAMUSCULAR | Status: DC | PRN
  Administered 2018-12-02: 30 mg via INTRAVENOUS

## 2018-12-02 MED ORDER — HYDROMORPHONE HCL 1 MG/ML IJ SOLN
INTRAMUSCULAR | Status: AC
  Administered 2018-12-02: 0.5 mg via INTRAVENOUS
  Filled 2018-12-02: qty 1

## 2018-12-02 MED ORDER — KETOROLAC TROMETHAMINE 30 MG/ML IJ SOLN
30.0000 mg | Freq: Four times a day (QID) | INTRAMUSCULAR | Status: DC
  Administered 2018-12-02 – 2018-12-03 (×4): 30 mg via INTRAVENOUS
  Filled 2018-12-02 (×4): qty 1

## 2018-12-02 MED ORDER — SUGAMMADEX SODIUM 200 MG/2ML IV SOLN
INTRAVENOUS | Status: DC | PRN
  Administered 2018-12-02: 110 mg via INTRAVENOUS

## 2018-12-02 MED ORDER — MORPHINE SULFATE (PF) 4 MG/ML IV SOLN
4.0000 mg | Freq: Once | INTRAVENOUS | Status: AC
  Administered 2018-12-02: 4 mg via INTRAVENOUS

## 2018-12-02 MED ORDER — ONDANSETRON HCL 4 MG/2ML IJ SOLN
4.0000 mg | Freq: Once | INTRAMUSCULAR | Status: AC
  Administered 2018-12-02: 4 mg via INTRAVENOUS
  Filled 2018-12-02: qty 2

## 2018-12-02 MED ORDER — ONDANSETRON HCL 4 MG/2ML IJ SOLN: 4.0000 mg | Freq: Four times a day (QID) | INTRAMUSCULAR | Status: DC | PRN

## 2018-12-02 MED ORDER — MORPHINE SULFATE (PF) 4 MG/ML IV SOLN
4.0000 mg | Freq: Once | INTRAVENOUS | Status: AC
  Administered 2018-12-02: 4 mg via INTRAVENOUS
  Filled 2018-12-02: qty 1

## 2018-12-02 MED ORDER — DIPHENHYDRAMINE HCL 50 MG/ML IJ SOLN
25.0000 mg | Freq: Once | INTRAMUSCULAR | Status: AC
  Administered 2018-12-02: 25 mg via INTRAVENOUS

## 2018-12-02 MED ORDER — MIDAZOLAM HCL 2 MG/2ML IJ SOLN
INTRAMUSCULAR | Status: AC
  Filled 2018-12-02: qty 2

## 2018-12-02 MED ORDER — ACETAMINOPHEN 500 MG PO TABS
1000.0000 mg | ORAL_TABLET | Freq: Four times a day (QID) | ORAL | Status: DC
  Administered 2018-12-02: 1000 mg via ORAL
  Filled 2018-12-02: qty 2

## 2018-12-02 MED ORDER — PIPERACILLIN-TAZOBACTAM 3.375 G IVPB 30 MIN
3.3750 g | Freq: Once | INTRAVENOUS | Status: AC
  Administered 2018-12-02: 3.375 g via INTRAVENOUS
  Filled 2018-12-02: qty 50

## 2018-12-02 MED ORDER — PROPOFOL 10 MG/ML IV BOLUS
INTRAVENOUS | Status: DC | PRN
  Administered 2018-12-02: 120 mg via INTRAVENOUS

## 2018-12-02 MED ORDER — BUPIVACAINE-EPINEPHRINE 0.25% -1:200000 IJ SOLN
INTRAMUSCULAR | Status: DC | PRN
  Administered 2018-12-02: 30 mL

## 2018-12-02 MED ORDER — SODIUM CHLORIDE 0.9 % IV SOLN
Freq: Once | INTRAVENOUS | Status: AC
  Administered 2018-12-02: 08:00:00 via INTRAVENOUS

## 2018-12-02 MED ORDER — DIPHENHYDRAMINE HCL 50 MG/ML IJ SOLN: 25.0000 mg | Freq: Four times a day (QID) | INTRAMUSCULAR | Status: DC | PRN

## 2018-12-02 SURGICAL SUPPLY — 37 items
APPLIER CLIP 5 13 M/L LIGAMAX5 (MISCELLANEOUS) ×4
BLADE CLIPPER SURG (BLADE) ×8 IMPLANT
CANISTER SUCT 1200ML W/VALVE (MISCELLANEOUS) ×4 IMPLANT
CHLORAPREP W/TINT 26ML (MISCELLANEOUS) ×4 IMPLANT
CLIP APPLIE 5 13 M/L LIGAMAX5 (MISCELLANEOUS) ×2 IMPLANT
COVER WAND RF STERILE (DRAPES) IMPLANT
CUTTER FLEX LINEAR 45M (STAPLE) ×4 IMPLANT
DERMABOND ADVANCED (GAUZE/BANDAGES/DRESSINGS) ×2
DERMABOND ADVANCED .7 DNX12 (GAUZE/BANDAGES/DRESSINGS) ×2 IMPLANT
ELECT CAUTERY BLADE 6.4 (BLADE) ×4 IMPLANT
ELECT REM PT RETURN 9FT ADLT (ELECTROSURGICAL) ×4
ELECTRODE REM PT RTRN 9FT ADLT (ELECTROSURGICAL) ×2 IMPLANT
GLOVE BIO SURGEON STRL SZ7 (GLOVE) ×4 IMPLANT
GOWN STRL REUS W/ TWL LRG LVL3 (GOWN DISPOSABLE) ×4 IMPLANT
GOWN STRL REUS W/TWL LRG LVL3 (GOWN DISPOSABLE) ×4
IRRIGATION STRYKERFLOW (MISCELLANEOUS) ×2 IMPLANT
IRRIGATOR STRYKERFLOW (MISCELLANEOUS) ×4
IV NS 1000ML (IV SOLUTION) ×2
IV NS 1000ML BAXH (IV SOLUTION) ×2 IMPLANT
NEEDLE HYPO 22GX1.5 SAFETY (NEEDLE) ×4 IMPLANT
NS IRRIG 500ML POUR BTL (IV SOLUTION) ×4 IMPLANT
PACK LAP CHOLECYSTECTOMY (MISCELLANEOUS) ×4 IMPLANT
PENCIL ELECTRO HAND CTR (MISCELLANEOUS) ×4 IMPLANT
POUCH SPECIMEN RETRIEVAL 10MM (ENDOMECHANICALS) ×4 IMPLANT
RELOAD 45 VASCULAR/THIN (ENDOMECHANICALS) ×4 IMPLANT
RELOAD STAPLE TA45 3.5 REG BLU (ENDOMECHANICALS) ×4 IMPLANT
SCISSORS METZENBAUM CVD 33 (INSTRUMENTS) IMPLANT
SHEARS HARMONIC ACE PLUS 36CM (ENDOMECHANICALS) IMPLANT
SLEEVE ENDOPATH XCEL 5M (ENDOMECHANICALS) ×4 IMPLANT
SPONGE LAP 18X18 RF (DISPOSABLE) ×4 IMPLANT
SUT MNCRL AB 4-0 PS2 18 (SUTURE) ×4 IMPLANT
SUT VICRYL 0 AB UR-6 (SUTURE) ×8 IMPLANT
SYR 20CC LL (SYRINGE) ×4 IMPLANT
TRAY FOLEY MTR SLVR 16FR STAT (SET/KITS/TRAYS/PACK) IMPLANT
TROCAR XCEL BLUNT TIP 100MML (ENDOMECHANICALS) ×4 IMPLANT
TROCAR XCEL NON-BLD 5MMX100MML (ENDOMECHANICALS) ×8 IMPLANT
TUBING EVAC SMOKE HEATED PNEUM (TUBING) ×4 IMPLANT

## 2018-12-02 NOTE — ED Triage Notes (Signed)
Low abd pain started yesterday moving lower and to right side.

## 2018-12-02 NOTE — Anesthesia Post-op Follow-up Note (Signed)
Anesthesia QCDR form completed.        

## 2018-12-02 NOTE — ED Provider Notes (Signed)
[facility] Emergency Department Provider Note       Time seen: ----------------------------------------- 7:44 AM on 12/02/2018 -----------------------------------------   I have reviewed the triage vital signs and the nursing notes.  HISTORY   Chief Complaint Abdominal Pain and Nausea   HPI Chelsea Allen is a 38 y.o. female with a history of anemia, depression, headaches, UTI who presents to the ED for abdominal pain.  Patient describes central abdominal pain that has then migrated to the right lower quadrant.  Pain onset was yesterday.  Patient has described that she has tried several things to try to alleviate her pain without any success.  She has moved her bowels normally.  She denies fevers, chills.  She has had nausea.  Past Medical History:  Diagnosis Date  . Anemia   . Anorexia nervosa with bulimia in college   none in 10+ years  . Depression   . H/O varicella   . Headache    Migraines  . Maternal anemia, with delivery 07/22/2012  . Mitral valve prolapse 2004  . Other disorder of muscle, ligament, and fascia    IITS-left knee  . Personal history of other mental disorder    anorexia nervosa  . PONV (postoperative nausea and vomiting)   . 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 Urinary tract infection, site not specified     Patient Active Problem List   Diagnosis Date Noted  . Postpartum care following cesarean delivery and BTL (6/1) 04/06/2018  . Acute blood loss anemia 04/06/2018  . Monochorionic diamniotic twin gestation in third trimester 04/05/2018  . Twins, both liveborn 04/05/2018  . Prothrombin gene mutation (HCC) 09/20/2017    Past Surgical History:  Procedure Laterality Date  . CESAREAN SECTION MULTI-GESTATIONAL N/A 04/05/2018   Procedure: Primary CESAREAN SECTION MULTI-GESTATIONAL;  Surgeon: Olivia Mackie, MD;  Location: [facility];  Service: Obstetrics;   Laterality: N/A;  EDD: 04/28/18 Allergy: Sulfa  . DILATION AND EVACUATION N/A 11/27/2016   Procedure: DILATATION AND EVACUATION with Ultrasound Guidance and Chromosome Studies;  Surgeon: Olivia Mackie, MD;  Location: [facility] ORS;  Service: Gynecology;  Laterality: N/A;  . WISDOM TOOTH EXTRACTION      Allergies Beef-derived products; Lambs quarters; Pork-derived products; and Sulfonamide derivatives  Social History Social History   Tobacco Use  . Smoking status: Never Smoker  . Smokeless tobacco: Never Used  Substance Use Topics  . Alcohol use: No    Frequency: Never  . Drug use: No   Review of Systems Constitutional: Negative for fever. Cardiovascular: Negative for chest pain. Respiratory: Negative for shortness of breath. Gastrointestinal: Positive for abdominal pain, nausea Musculoskeletal: Negative for back pain. Skin: Negative for rash. Neurological: Negative for headaches, focal weakness or numbness.  All systems negative/normal/unremarkable except as stated in the HPI  ____________________________________________   PHYSICAL EXAM:  VITAL SIGNS: ED Triage Vitals  Enc Vitals Group     BP 12/02/18 0729 (!) 113/94     Pulse Rate 12/02/18 0729 (!) 102     Resp 12/02/18 0729 16     Temp 12/02/18 0729 99 F (37.2 C)     Temp Source 12/02/18 0729 Oral     SpO2 12/02/18 0729 100 %     Weight 12/02/18 0723 120 lb (54.4 kg)     Height 12/02/18 0723 5\' 4"  (1.626 m)     Head Circumference --      Peak Flow --  Pain Score 12/02/18 0722 7     Pain Loc --      Pain Edu? --      Excl. in GC? --    Constitutional: Alert and oriented.  Mild distress from pain Eyes: Conjunctivae are normal. Normal extraocular movements. ENT      Head: Normocephalic and atraumatic.      Nose: No congestion/rhinnorhea.      Mouth/Throat: Mucous membranes are moist.      Neck: No stridor. Cardiovascular: Normal rate, regular rhythm. No murmurs, rubs, or gallops. Respiratory: Normal  respiratory effort without tachypnea nor retractions. Breath sounds are clear and equal bilaterally. No wheezes/rales/rhonchi. Gastrointestinal: Periumbilical and right lower quadrant tenderness, most pain is at McBurney's point.  Normal bowel sounds. Musculoskeletal: Nontender with normal range of motion in extremities. No lower extremity tenderness nor edema. Neurologic:  Normal speech and language. No gross focal neurologic deficits are appreciated.  Skin:  Skin is warm, dry and intact. No rash noted. Psychiatric: Mood and affect are normal. Speech and behavior are normal.  ____________________________________________  ED COURSE:  As part of my medical decision making, I reviewed the following data within the electronic MEDICAL RECORD NUMBER History obtained from family if available, nursing notes, old chart and ekg, as well as notes from prior ED visits. Patient presented for right lower quadrant pain, we will assess with labs and imaging as indicated at this time.   Procedures ____________________________________________   LABS (pertinent positives/negatives)  Labs Reviewed  COMPREHENSIVE METABOLIC PANEL - Abnormal; Notable for the following components:      Result Value   Potassium 3.4 (*)    Glucose, Bld 107 (*)    All other components within normal limits  CBC - Abnormal; Notable for the following components:   WBC 10.7 (*)    All other components within normal limits  LIPASE, BLOOD  URINALYSIS, COMPLETE (UACMP) WITH MICROSCOPIC  I-STAT BETA HCG BLOOD, ED (NOT ORDERABLE)    RADIOLOGY Images were viewed by me  CT of the abdomen pelvis with contrast Reveals acute appendicitis with appendicolith ____________________________________________   DIFFERENTIAL DIAGNOSIS   Appendicitis, UTI, pyelonephritis, ovarian cyst, ovarian torsion  FINAL ASSESSMENT AND PLAN  Appendicitis   Plan: The patient had presented for right lower quadrant pain with suspected appendicitis.  Patient's labs did reveal mild leukocytosis but were otherwise unremarkable. Patient's imaging revealed acute appendicitis with appendicolith.  I have ordered IV Zosyn for her.  I discussed with general surgery for admission.   Ulice Dash, MD    Note: This note was generated in part or whole with voice recognition software. Voice recognition is usually quite accurate but there are transcription errors that can and very often do occur. I apologize for any typographical errors that were not detected and corrected.     Emily Filbert, MD 12/02/18 [phone]

## 2018-12-02 NOTE — Anesthesia Preprocedure Evaluation (Signed)
Anesthesia Evaluation  Patient identified by MRN, date of birth, ID band Patient awake    Reviewed: Allergy & Precautions, H&P , NPO status , Patient's Chart, lab work & pertinent test results, reviewed documented beta blocker date and time   History of Anesthesia Complications (+) PONV and history of anesthetic complications  Airway Mallampati: II  TM Distance: >3 FB Neck ROM: full    Dental no notable dental hx.    Pulmonary neg pulmonary ROS,    Pulmonary exam normal breath sounds clear to auscultation       Cardiovascular + Valvular Problems/Murmurs MVP  Rhythm:regular Rate:Normal     Neuro/Psych  Headaches, PSYCHIATRIC DISORDERS Depression    GI/Hepatic Neg liver ROS,   Endo/Other  negative endocrine ROS  Renal/GU negative Renal ROS     Musculoskeletal negative musculoskeletal ROS (+)   Abdominal   Peds  Hematology  (+) anemia ,   Anesthesia Other Findings Past Medical History: No date: Anemia in college: Anorexia nervosa with bulimia     Comment:  none in 10+ years No date: Depression No date: H/O varicella No date: Headache     Comment:  Migraines 07/22/2012: Maternal anemia, with delivery 2004: Mitral valve prolapse No date: Other disorder of muscle, ligament, and fascia     Comment:  IITS-left knee No date: Personal history of other mental disorder     Comment:  anorexia nervosa No date: PONV (postoperative nausea and vomiting) 09/20/2017: Prothrombin gene mutation (HCC)     Comment:  Heterozygote  12/24/16  No hx of thrombosis No date: Urinary tract infection, site not specified  Reproductive/Obstetrics negative OB ROS                             Anesthesia Physical  Anesthesia Plan  ASA: II  Anesthesia Plan: General   Post-op Pain Management:    Induction: Intravenous  PONV Risk Score and Plan:   Airway Management Planned: Oral ETT  Additional Equipment:    Intra-op Plan:   Post-operative Plan: Extubation in OR  Informed Consent: I have reviewed the patients History and Physical, chart, labs and discussed the procedure including the risks, benefits and alternatives for the proposed anesthesia with the patient or authorized representative who has indicated his/her understanding and acceptance.     Dental Advisory Given  Plan Discussed with: CRNA and Surgeon  Anesthesia Plan Comments:         Anesthesia Quick Evaluation

## 2018-12-02 NOTE — ED Notes (Signed)
Patient transported to CT 

## 2018-12-02 NOTE — Op Note (Signed)
laparascopic appendectomy with Repair of umbilical hernia  Sanjuan Dame Date of operation:  12/02/2018  Indications: The patient presented with a history of  abdominal pain. Workup has revealed findings consistent with acute appendicitis.  Pre-operative Diagnosis: Acute appendicitis without mention of peritonitis  Post-operative Diagnosis: Same  Surgeon: Sterling Big, MD, FACS  Anesthesia: General with endotracheal tube  Findings: UH 1.5 cms                  Non perforated appendicitis  Estimated Blood Loss: 5cc         Specimens: appendix, hernia sac         Complications:  none  Procedure Details  The patient was seen again in the preop area. The options of surgery versus observation were reviewed with the patient and/or family. The risks of bleeding, infection, recurrence of symptoms, negative laparoscopy, potential for an open procedure, bowel injury, abscess or infection, were all reviewed as well. The patient was taken to Operating Room, identified as Sanjuan Dame and the procedure verified as laparoscopic appendectomy. A Time Out was held and the above information confirmed.  The patient was placed in the supine position and general anesthesia was induced.  Antibiotic prophylaxis was administered and VT E prophylaxis was in place. A Foley catheter was placed by the nursing staff.   The abdomen was prepped and draped in a sterile fashion. An infraumbilical incision was made. Umbilical hernia encounter and sac dissected and excised.. Two vicryl stitches were placed on the fascia and a Hasson trocar inserted. Pneumoperitoneum obtained. Two 5 mm ports were placed under direct visualization.   The appendix was identified and found to be acutely inflamed  The appendix was carefully dissected. The mesoappendix was divided withHarmonic scalpel. The base of the appendix was dissected out and divided with a standard load Endo GIA.The appendix was placed in a Endo Catch bag and  removed via the Hasson port. The right lower quadrant and pelvis was then irrigated with  normal saline which was aspirated. Inspection  failed to identify any additional bleeding and there were no signs of bowel injury. Again the right lower quadrant was inspected there was no sign of bleeding or bowel injury therefore pneumoperitoneum was released, all ports were removed.  The umbilical defect  (hernia)_was closed with 0 ethibond interrupted sutures and the skin incisions were approximated with subcuticular 4-0 Monocryl. Dermabond was placed The patient tolerated the procedure well, there were no complications. The sponge lap and needle count were correct at the end of the procedure.  The patient was taken to the recovery room in stable condition to be admitted for continued care.    Sterling Big, MD FACS

## 2018-12-02 NOTE — H&P (Signed)
[facility] SURGICAL HISTORY & PHYSICAL (cpt [phone])  HISTORY OF PRESENT ILLNESS (HPI):  38 y.o. female presented to [facility] ED today for abdominal pain. Patient reports the onset of periumbilical abdominal pain about 24 hours ago which has since migrated to her RLQ and become a sharp constant pain. Nothing makes the pain better and movement makes the pain worse. She noted associated fever and nausea with this pain. No complaints of chills, cp, sob, emesis, or bladder/bowel changes. Never had a history of this pain in the past. Only previous abdominal surgery is c-section in June of 2019. Work up in the ED was concerning for mild leukocytosis and acute appendicitis.   General surgery is consulted by emergency medicine physician Dr Cherene JulianJonathon Williams, MD for evaluation and management of acute appendicitis.    PAST MEDICAL HISTORY (PMH):  Past Medical History:  Diagnosis Date  . Anemia   . Anorexia nervosa with bulimia in college   none in 10+ years  . Depression   . H/O varicella   . Headache    Migraines  . Maternal anemia, with delivery 07/22/2012  . Mitral valve prolapse 2004  . Other disorder of muscle, ligament, and fascia    IITS-left knee  . Personal history of other mental disorder    anorexia nervosa  . PONV (postoperative nausea and vomiting)   . 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 Urinary tract infection, site not specified     Reviewed. Otherwise negative.   PAST SURGICAL HISTORY (PSH):  Past Surgical History:  Procedure Laterality Date  . CESAREAN SECTION MULTI-GESTATIONAL N/A 04/05/2018   Procedure: Primary CESAREAN SECTION MULTI-GESTATIONAL;  Surgeon: Olivia Mackieaavon, Richard, MD;  Location: [facility];  Service: Obstetrics;  Laterality: N/A;  EDD: 04/28/18 Allergy: Sulfa  . DILATION AND EVACUATION N/A 11/27/2016   Procedure: DILATATION AND EVACUATION with Ultrasound Guidance and Chromosome Studies;   Surgeon: Olivia Mackieichard Taavon, MD;  Location: [facility] ORS;  Service: Gynecology;  Laterality: N/A;  . WISDOM TOOTH EXTRACTION      Reviewed. Otherwise negative.   MEDICATIONS:  Prior to Admission medications   Medication Sig Start Date End Date Taking? Authorizing Provider  medroxyPROGESTERone (PROVERA) 10 MG tablet Take 10 mg by mouth every 30 (thirty) days. 11/02/18  Yes [provider]  Multiple Vitamin (MULTIVITAMIN WITH MINERALS) TABS tablet Take 1 tablet by mouth daily.   Yes [provider]  ibuprofen (ADVIL,MOTRIN) 800 MG tablet Take 1 tablet (800 mg total) by mouth every 8 (eight) hours as needed. 04/08/18   Marlinda MikeBailey, Tanya, CNM     ALLERGIES:  Allergies  Allergen Reactions  . Beef-Derived Products Hives  . Lambs [employer] Energy[employer]arters Hives  . Pork-Derived Conservation officer, natureroducts Hives  . Sulfonamide Derivatives Hives     SOCIAL HISTORY:  Social History   Socioeconomic History  . Marital status: Married    Spouse name: Not on file  . Number of children: Not on file  . Years of education: Not on file  . Highest education level: Not on file  Occupational History  . 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 Financial resource strain: Not on file  . Food insecurity:    Worry: Not on file    Inability: Not on file  . Transportation needs:    Medical: Not on file    Non-medical: Not on file  Tobacco Use  . Smoking status: Never Smoker  . Smokeless tobacco: Never Used  Substance and Sexual Activity  . Alcohol use: No    Frequency: Never  . Drug use: No  . Sexual activity: Yes    Birth control/protection: None  Lifestyle  . Physical activity:    Days per week: Not on file    Minutes per session: Not on file  . Stress: Not on file  Relationships  . Social connections:    Talks on phone: Not on file    Gets together: Not on file    Attends religious service: Not on file    Active member of club or organization: Not on file    Attends meetings of clubs or  organizations: Not on file    Relationship status: Not on file  . Intimate partner violence:    Fear of current or ex partner: Not on file    Emotionally abused: Not on file    Physically abused: Not on file    Forced sexual activity: Not on file  Other Topics Concern  . 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 Depression Mother   . Depression Sister   . Depression Maternal Grandfather   . Cancer Paternal Grandfather        lung  . Breast cancer Maternal Grandmother     Otherwise negative.   REVIEW OF SYSTEMS:  Review of Systems  Constitutional: Positive for fever. Negative for chills.  HENT: Negative for congestion and sinus pain.   Respiratory: Negative for cough and shortness of breath.   Cardiovascular: Negative for chest pain and palpitations.  Gastrointestinal: Positive for abdominal pain and nausea. Negative for constipation, diarrhea and vomiting.  Genitourinary: Negative for dysuria and urgency.  Neurological: Negative for dizziness and headaches.  All other systems reviewed and are negative.   VITAL SIGNS:  Temp:  [99 F (37.2 C)] 99 F (37.2 C) (01/28 0729) Pulse Rate:  [79-102] 86 (01/28 0839) Resp:  [15-16] 15 (01/28 0839) BP: (113-124)/(79-94) 119/79 (01/28 0839) SpO2:  [100 %] 100 % (01/28 0839) Weight:  [54.4 kg] 54.4 kg (01/28 0723)     Height: 5\' 4"  (162.6 cm) Weight: 54.4 kg BMI (Calculated): 20.59   PHYSICAL EXAM:  Physical Exam Vitals signs and nursing note reviewed. Exam conducted with a chaperone present.  Constitutional:      General: She is not in acute distress.    Appearance: She is well-developed. She is not ill-appearing.  HENT:     Head: Normocephalic and atraumatic.  Eyes:     General: No scleral icterus.    Extraocular Movements: Extraocular movements intact.  Cardiovascular:     Rate and Rhythm: Normal  rate and regular rhythm.     Heart sounds: Normal heart sounds. No murmur. No friction rub. No gallop.   Pulmonary:     Effort: Pulmonary effort is normal. No respiratory distress.     Breath sounds: Normal breath sounds. No wheezing or rhonchi.  Abdominal:     General: Abdomen is flat. A surgical scar is present. There is no distension.     Palpations: Abdomen is soft.     Tenderness: There is abdominal tenderness in the right lower quadrant. There is no guarding or rebound. Positive signs include McBurney's sign. Negative signs include Murphy's sign and Rovsing's sign.     Comments: Previous low c-section incision is well healed, + Rovsing's  Genitourinary:    Comments: Deferred Skin:    General: Skin is warm and dry.     Coloration: Skin is not jaundiced or pale.  Neurological:     General: No focal deficit present.     Mental Status: She is alert and oriented to person, place, and time.  Psychiatric:        Mood and Affect: Mood normal.        Behavior: Behavior normal.     INTAKE/OUTPUT:  This shift: Total I/O In: 1050 [I.V.:1000; IV Piggyback:50] Out: -   Last 2 shifts: @IOLAST2SHIFTS @  Labs:  CBC Latest Ref Rng & Units 12/02/2018 04/06/2018 04/04/2018  WBC 4.0 - 10.5 K/uL 10.7(H) 12.8(H) 9.0  Hemoglobin 12.0 - 15.0 g/dL 16.112.1 0.9(U8.2(L) 11.0(L)  Hematocrit 36.0 - 46.0 % 36.2 25.5(L) 34.3(L)  Platelets 150 - 400 K/uL 271 165 191   CMP Latest Ref Rng & Units 12/02/2018 03/18/2012  Glucose 70 - 99 mg/dL 045(W107(H) -  BUN 6 - 20 mg/dL 17 -  Creatinine 0.980.44 - 1.00 mg/dL 1.190.68 -  Sodium 147135 - 829145 mmol/L 136 -  Potassium 3.5 - 5.1 mmol/L 3.4(L) 4.0  Chloride 98 - 111 mmol/L 105 -  CO2 22 - 32 mmol/L 23 -  Calcium 8.9 - 10.3 mg/dL 9.2 -  Total Protein 6.5 - 8.1 g/dL 7.2 -  Total Bilirubin 0.3 - 1.2 mg/dL 0.9 -  Alkaline Phos 38 - 126 U/L 48 -  AST 15 - 41 U/L 15 -  ALT 0 - 44 U/L 12 -     Imaging studies:   CT Abdomen/Pelvis (12/02/2018) personally reviewed and radiologist  report reviewed:  IMPRESSION: The examination is positive for acute appendicitis with a small volume of partially rim enhancing fluid adjacent to the appendix compatible with phlegmon.    Assessment/Plan: (ICD-10's: K35.0) 38 y.o. female with mild leukocytosis and RLQ abdominal pain attributable to acute appendicitis and appendicolith, complicated by pertinent comorbidities including a history of anemia, history of anxiety/depression, mitral valve prolapse, and 6 months post-partum s/p c-section.    - Admit to general surgery  - NPO + IVF  - Continue IV ABx (Zosyn)  - Continue to monitor abdominal examination   - Pain control as needed, antiemetics as needed  - Will plan for laparoscopic appendectomy this afternoon with Dr Everlene FarrierPabon, MD pending OR and anesthesia availability.    - All risks, benefits, and alternatives to above procedure were discussed with the patient and her family, all of their questions were answered to their expressed satisfaction, patient expresses she wishes to proceed, and informed consent was obtained.  - medical management of comorbidities   - DVT prophylaxis  All of the above findings and recommendations were discussed with the patient and her family, and all of her and her family's questions were answered to their expressed satisfaction.  -- Lynden OxfordZachary Giani Betzold, PA-C [facility] 12/02/2018, 10:43 AM [phone] M-F: 7am - 4pm

## 2018-12-02 NOTE — Transfer of Care (Signed)
Immediate Anesthesia Transfer of Care Note  Patient: Chelsea Allen  Procedure(s) Performed: APPENDECTOMY LAPAROSCOPIC (N/A ) HERNIA REPAIR UMBILICAL ADULT  Patient Location: PACU  Anesthesia Type:General  Level of Consciousness: awake, alert , oriented and patient cooperative  Airway & Oxygen Therapy: Patient Spontanous Breathing and Patient connected to nasal cannula oxygen  Post-op Assessment: Report given to RN and Post -op Vital signs reviewed and stable  Post vital signs: Reviewed and stable  Last Vitals:  Vitals Value Taken Time  BP 107/71 12/02/2018  7:18 PM  Temp    Pulse 83 12/02/2018  7:22 PM  Resp 20 12/02/2018  7:22 PM  SpO2 99 % 12/02/2018  7:22 PM  Vitals shown include unvalidated device data.  Last Pain:  Vitals:   12/02/18 1330  TempSrc:   PainSc: 0-No pain      Patients Stated Pain Goal: 2 (12/02/18 8768)  Complications: No apparent anesthesia complications

## 2018-12-02 NOTE — Anesthesia Procedure Notes (Signed)
Procedure Name: Intubation Date/Time: 12/02/2018 6:10 PM Performed by: Waldo LaineJustis, Audreana Hancox, CRNA Pre-anesthesia Checklist: Patient identified, Patient being monitored, Timeout performed, Emergency Drugs available and Suction available Patient Re-evaluated:Patient Re-evaluated prior to induction Oxygen Delivery Method: Circle system utilized Preoxygenation: Pre-oxygenation with 100% oxygen Induction Type: IV induction Ventilation: Mask ventilation without difficulty Laryngoscope Size: Miller and 2 Grade View: Grade I Tube type: Oral Tube size: 7.0 mm Number of attempts: 1 Airway Equipment and Method: Stylet Placement Confirmation: ETT inserted through vocal cords under direct vision,  positive ETCO2 and breath sounds checked- equal and bilateral Secured at: 20 cm Tube secured with: Tape Dental Injury: Teeth and Oropharynx as per pre-operative assessment

## 2018-12-03 ENCOUNTER — Encounter: Payer: Self-pay | Admitting: Surgery

## 2018-12-03 LAB — BASIC METABOLIC PANEL
Anion gap: 4 — ABNORMAL LOW (ref 5–15)
BUN: 11 mg/dL (ref 6–20)
CO2: 26 mmol/L (ref 22–32)
CREATININE: 0.81 mg/dL (ref 0.44–1.00)
Calcium: 8 mg/dL — ABNORMAL LOW (ref 8.9–10.3)
Chloride: 106 mmol/L (ref 98–111)
GFR calc Af Amer: >60 mL/min (ref >60)
GFR calc non Af Amer: >60 mL/min (ref >60)
Glucose, Bld: 105 mg/dL — ABNORMAL HIGH (ref 70–99)
Potassium: 3.3 mmol/L — ABNORMAL LOW (ref 3.5–5.1)
Sodium: 136 mmol/L (ref 135–145)

## 2018-12-03 LAB — CBC
HCT: 27.8 % — ABNORMAL LOW (ref 36.0–46.0)
Hemoglobin: 9.1 g/dL — ABNORMAL LOW (ref 12.0–15.0)
MCH: 30 pg (ref 26.0–34.0)
MCHC: 32.7 g/dL (ref 30.0–36.0)
MCV: 91.7 fL (ref 80.0–100.0)
NRBC: 0 % (ref 0.0–0.2)
Platelets: 203 10*3/uL (ref 150–400)
RBC: 3.03 MIL/uL — AB (ref 3.87–5.11)
RDW: 12.1 % (ref 11.5–15.5)
WBC: 8.6 10*3/uL (ref 4.0–10.5)

## 2018-12-03 LAB — HIV ANTIBODY (ROUTINE TESTING W REFLEX): HIV Screen 4th Generation wRfx: NONREACTIVE

## 2018-12-03 MED ORDER — LACTATED RINGERS IV BOLUS
1000.0000 mL | Freq: Once | INTRAVENOUS | Status: AC
  Administered 2018-12-03: 1000 mL via INTRAVENOUS

## 2018-12-03 MED ORDER — TRAMADOL HCL 50 MG PO TABS: 50.0000 mg | ORAL_TABLET | Freq: Four times a day (QID) | ORAL | 0 refills | Status: DC | PRN

## 2018-12-03 MED ORDER — POTASSIUM CHLORIDE CRYS ER 20 MEQ PO TBCR
20.0000 meq | EXTENDED_RELEASE_TABLET | Freq: Once | ORAL | Status: AC
  Administered 2018-12-03: 20 meq via ORAL
  Filled 2018-12-03: qty 1

## 2018-12-03 NOTE — Discharge Summary (Addendum)
[facility] SURGICAL DISCHARGE SUMMARY  Patient ID: Chelsea Allen MRN: [id number] DOB/AGE: 39-Feb-1982 39 y.o.  Admit date: 12/02/2018 Discharge date: 12/03/2018  Discharge Diagnoses Acute Appendicitis  Consultants None  Procedures -- 12/02/2018:  Laparoscopic Appendectomy  HPI: 38 y.o. female presented to [facility] ED today for abdominal pain. Patient reports the onset of periumbilical abdominal pain about 24 hours ago which has since migrated to her RLQ and become a sharp constant pain. Nothing makes the pain better and movement makes the pain worse. She noted associated fever and nausea with this pain. No complaints of chills, cp, sob, emesis, or bladder/bowel changes. Never had a history of this pain in the past. Only previous abdominal surgery is c-section in June of 2019. Work up in the ED was concerning for mild leukocytosis and acute appendicitis.   Hospital Course: Informed consent was obtained and documented, and patient underwent uneventful laparoscopic appendectomy (Dr Everlene Farrier, 12/02/2018).  Post-operatively, patient's pain improved/resolved and advancement of patient's diet and ambulation were well-tolerated. The remainder of patient's hospital course was essentially unremarkable, and discharge planning was initiated accordingly with patient safely able to be discharged home with appropriate discharge instructions, pain control, and outpatient  follow-up after all of her and her family's questions were answered to their expressed satisfaction.  Discharge Condition: Good   Physical Examination:  Constitutional: Well appearing female, NAD Pulmonary: Normal effort, no respiratory distress Gastrointestinal: Soft, incisional soreness, non distended, no rebound Skin: Laparoscopic incisions are CDI, no erythema   Allergies as of 12/03/2018      Reactions   Beef-derived Products Hives   Lambs Quarters Hives   Pork-derived Products Hives   Sulfonamide Derivatives  Hives      Medication List    TAKE these medications   ibuprofen 800 MG tablet Commonly known as:  ADVIL,MOTRIN Take 1 tablet (800 mg total) by mouth every 8 (eight) hours as needed.   medroxyPROGESTERone 10 MG tablet Commonly known as:  PROVERA Take 10 mg by mouth every 30 (thirty) days.   multivitamin with minerals Tabs tablet Take 1 tablet by mouth daily.   traMADol 50 MG tablet Commonly known as:  ULTRAM Take 1 tablet (50 mg total) by mouth every 6 (six) hours as needed for severe pain.        Follow-up Information    Pabon, [place] F, MD. Schedule an appointment as soon as possible for a visit in 2 week(s).   Specialty:  General Surgery Why:  s/p lap appy Contact information: [address] [place] [address] [phone]            -- Lynden Oxford , PA-C [facility]  12/03/2018, 11:18 AM [phone] M-F: 7am - 4pm

## 2018-12-03 NOTE — Progress Notes (Signed)
Notified dr.pabon of pt b/p of 84/45 and acknowledged and no new orders. Cont to monitor b/p pr dr pabon

## 2018-12-03 NOTE — Discharge Instructions (Signed)
In addition to included general post-operative instructions for laparoscopic appendectomy (Appendix Removal),  Diet: Resume home diet.   Activity: No heavy lifting >20 pounds (children, pets, laundry, garbage) or strenuous activity until follow-up, but light activity and walking are encouraged. Do not drive or drink alcohol if taking narcotic pain medications.  Wound care: 2 days after surgery (01/31), you may shower/get incision wet with soapy water and pat dry (do not rub incisions), but no baths or submerging incision underwater until follow-up.   Medications: Resume all home medications. For mild to moderate pain: acetaminophen (Tylenol) or ibuprofen/naproxen (if no kidney disease). Combining Tylenol with alcohol can substantially increase your risk of causing liver disease. Narcotic pain medications, if prescribed, can be used for severe pain, though may cause nausea, constipation, and drowsiness. Do not combine Tylenol and Percocet (or similar) within a 6 hour period as Percocet (and similar) contain(s) Tylenol. If you do not need the narcotic pain medication, you do not need to fill the prescription.  Call office [phone] / [phone]) at any time if any questions, worsening pain, fevers/chills, bleeding, drainage from incision site, or other concerns.  Laparoscopic Appendectomy, Adult, Care After This sheet gives you information about how to care for yourself after your procedure. Your health care provider may also give you more specific instructions. If you have problems or questions, contact your health care provider. What can I expect after the procedure? After the procedure, it is common to have:  Little energy for normal activities.  Mild pain in the area where the incisions were made.  Difficulty passing stool (constipation). This can be caused by: ? Pain medicine. ? A decrease in your activity. Follow these instructions at home: Medicines  Take over-the-counter and  prescription medicines only as told by your health care provider.  If you were prescribed an antibiotic medicine, take it as told by your health care provider. Do not stop taking the antibiotic even if you start to feel better.  Do not drive or use heavy machinery while taking prescription pain medicine.  Ask your health care provider if the medicine prescribed to you can cause constipation. You may need to take steps to prevent or treat constipation, such as: ? Drink enough fluid to keep your urine pale yellow. ? Take over-the-counter or prescription medicines. ? Eat foods that are high in fiber, such as beans, whole grains, and fresh fruits and vegetables. ? Limit foods that are high in fat and processed sugars, such as fried or sweet foods. Incision care   Follow instructions from your health care provider about how to take care of your incisions. Make sure you: ? Wash your hands with soap and water before and after you change your bandage (dressing). If soap and water are not available, use hand sanitizer. ? Change your dressing as told by your health care provider. ? Leave stitches (sutures), skin glue, or adhesive strips in place. These skin closures may need to stay in place for 2 weeks or longer. If adhesive strip edges start to loosen and curl up, you may trim the loose edges. Do not remove adhesive strips completely unless your health care provider tells you to do that.  Check your incision areas every day for signs of infection. Check for: ? Redness, swelling, or pain. ? Fluid or blood. ? Warmth. ? Pus or a bad smell. Bathing  Keep your incisions clean and dry. Clean them as often as told by your health care provider. To do this: 1. Gently  wash the incisions with soap and water. 2. Rinse the incisions with water to remove all soap. 3. Pat the incisions dry with a clean towel. Do not rub the incisions.  Do not take baths, swim, or use a hot tub for 2 weeks, or until your  health care provider approves. You may take showers after 48 hours. Activity   Do not drive for 24 hours if you were given a sedative during your procedure.  Rest after the procedure. Return to your normal activities as told by your health care provider. Ask your health care provider what activities are safe for you.  For 3 weeks, or for as long as told by your health care provider: ? Do not lift anything that is heavier than 10 lb (4.5 kg), or the limit that you are told. ? Do not play contact sports. General instructions  If you were sent home with a drain, follow instructions from your health care provider about how to care for it.  Take deep breaths. This helps to prevent your lungs from developing an infection (pneumonia).  Keep all follow-up visits as told by your health care provider. This is important. Contact a health care provider if:  You have redness, swelling, or pain around an incision.  You have fluid or blood coming from an incision.  Your incision feels warm to the touch.  You have pus or a bad smell coming from an incision or dressing.  Your incision edges break open after your sutures have been removed.  You have increasing pain in your shoulders.  You feel dizzy or you faint.  You develop shortness of breath.  You keep feeling nauseous or you are vomiting.  You have diarrhea or you cannot control your bowel functions.  You lose your appetite.  You develop swelling or pain in your legs.  You develop a rash. Get help right away if you have:  A fever.  Difficulty breathing.  Sharp pains in your chest. Summary  After a laparoscopic appendectomy, it is common to have little energy for normal activities, mild pain in the area of the incisions, and constipation.  Infection is the most common complication after this procedure. Follow your health care provider's instructions about caring for yourself after the procedure.  Rest after the procedure.  Return to your normal activities as told by your health care provider.  Contact your health care provider if you notice signs of infection around your incisions or you develop shortness of breath. Get help right away if you have a fever, chest pain, or difficulty breathing. This information is not intended to replace advice given to you by your health care provider. Make sure you discuss any questions you have with your health care provider. Document Released: 10/22/2005 Document Revised: 04/24/2018 Document Reviewed: 04/24/2018 Elsevier Interactive Patient Education  2019 [employer].

## 2018-12-05 LAB — SURGICAL PATHOLOGY

## 2018-12-08 NOTE — Anesthesia Postprocedure Evaluation (Signed)
Anesthesia Post Note  Patient: Chelsea Allen  Procedure(s) Performed: APPENDECTOMY LAPAROSCOPIC (N/A ) HERNIA REPAIR UMBILICAL ADULT  Patient location during evaluation: PACU Anesthesia Type: General Level of consciousness: awake and alert and oriented Pain management: pain level controlled Vital Signs Assessment: post-procedure vital signs reviewed and stable Respiratory status: spontaneous breathing Cardiovascular status: blood pressure returned to baseline Anesthetic complications: no     Last Vitals:  Vitals:   12/03/18 1129 12/03/18 1423  BP: (!) 86/51 (!) 98/59  Pulse: 83 65  Resp:    Temp: 36.9 C   SpO2: 99%     Last Pain:  Vitals:   12/03/18 1129  TempSrc: Oral  PainSc:                  Berwyn Bigley

## 2018-12-17 ENCOUNTER — Other Ambulatory Visit: Payer: Self-pay

## 2018-12-17 ENCOUNTER — Ambulatory Visit (INDEPENDENT_AMBULATORY_CARE_PROVIDER_SITE_OTHER): Payer: BC Managed Care – PPO | Admitting: Surgery

## 2018-12-17 ENCOUNTER — Encounter: Payer: Self-pay | Admitting: Surgery

## 2018-12-17 VITALS — BP 106/72 | HR 75 | Temp 97.9°F | Ht 64.0 in | Wt 126.0 lb

## 2018-12-17 DIAGNOSIS — Z09 Encounter for follow-up examination after completed treatment for conditions other than malignant neoplasm: Secondary | ICD-10-CM

## 2018-12-17 NOTE — Progress Notes (Signed)
S/p lap appy Path d/w pt No complaints taking po  PE NAD Abd: incisions c/d/i, no infection  A/p Doing very well No complications No heavy lifting rtc prn

## 2018-12-17 NOTE — Patient Instructions (Addendum)
The patient is aware to call back for any questions or new concerns. Resume activities as tolerated. Recommend no lifting over 20 pounds til March 10  GENERAL POST-OPERATIVE PATIENT INSTRUCTIONS   WOUND CARE INSTRUCTIONS:  Keep a dry clean dressing on the wound if there is drainage. The initial bandage may be removed after 24 hours.  Once the wound has quit draining you may leave it open to air.  If clothing rubs against the wound or causes irritation and the wound is not draining you may cover it with a dry dressing during the daytime.  Try to keep the wound dry and avoid ointments on the wound unless directed to do so.  If the wound becomes bright red and painful or starts to drain infected material that is not clear, please contact your physician immediately.  If the wound is mildly pink and has a thick firm ridge underneath it, this is normal, and is referred to as a healing ridge.  This will resolve over the next 4-6 weeks.  BATHING: You may shower if you have been informed of this by your surgeon. However, Please do not submerge in a tub, hot tub, or pool until incisions are completely sealed or have been told by your surgeon that you may do so.  DIET:  You may eat any foods that you can tolerate.  It is a good idea to eat a high fiber diet and take in plenty of fluids to prevent constipation.  If you do become constipated you may want to take a mild laxative or take ducolax tablets on a daily basis until your bowel habits are regular.  Constipation can be very uncomfortable, along with straining, after recent surgery.  ACTIVITY:  You are encouraged to cough and deep breath or use your incentive spirometer if you were given one, every 15-30 minutes when awake.  This will help prevent respiratory complications and low grade fevers post-operatively if you had a general anesthetic.  You may want to hug a pillow when coughing and sneezing to add additional support to the surgical area, if you had  abdominal or chest surgery, which will decrease pain during these times.  You are encouraged to walk and engage in light activity for the next two weeks.    . At that time- Listen to your body when lifting, if you have pain when lifting, stop and then try again in a few days. Soreness after doing exercises or activities of daily living is normal as you get back in to your normal routine.  MEDICATIONS:  Try to take narcotic medications and anti-inflammatory medications, such as tylenol, ibuprofen, naprosyn, etc., with food.  This will minimize stomach upset from the medication.  Should you develop nausea and vomiting from the pain medication, or develop a rash, please discontinue the medication and contact your physician.  You should not drive, make important decisions, or operate machinery when taking narcotic pain medication.  SUNBLOCK Use sun block to incision area over the next year if this area will be exposed to sun. This helps decrease scarring and will allow you avoid a permanent darkened area over your incision.  QUESTIONS:  Please feel free to call our office if you have any questions, and we will be glad to assist you.

## 2019-07-20 IMAGING — CT CT ABD-PELV W/ CM
2 of 4 series · 16 of 46 positions shown, 18 images · IV contrast (iopamidol)
Comparison: None.

CLINICAL DATA: Right lower quadrant pain and low-grade fever for 21
hours. Elevated white blood cell count. Question appendicitis.

EXAM:
CT ABDOMEN AND PELVIS WITH CONTRAST
TECHNIQUE: Multidetector CT imaging of the abdomen and pelvis was performed
using the standard protocol following bolus administration of
intravenous contrast.
CONTRAST:  100 mL AL9U6B-IRR IOPAMIDOL (AL9U6B-IRR) INJECTION 61%

[Series 2: axial st · axial · 0.61mm/px · z∈[-970,-580]mm · 13 of 86 slices shown, 15 images]
[im 4/86  soft-tissue]
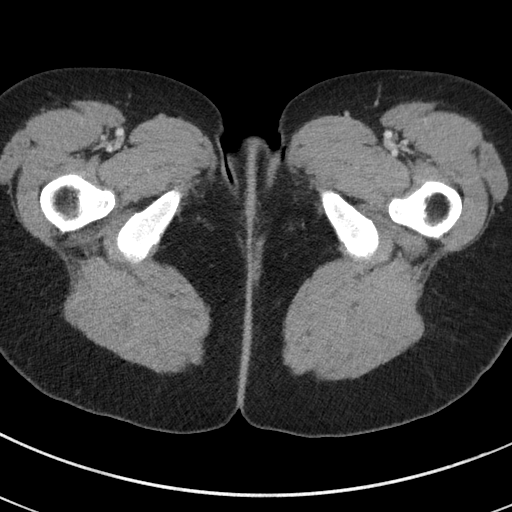
[im 4/86  bone]
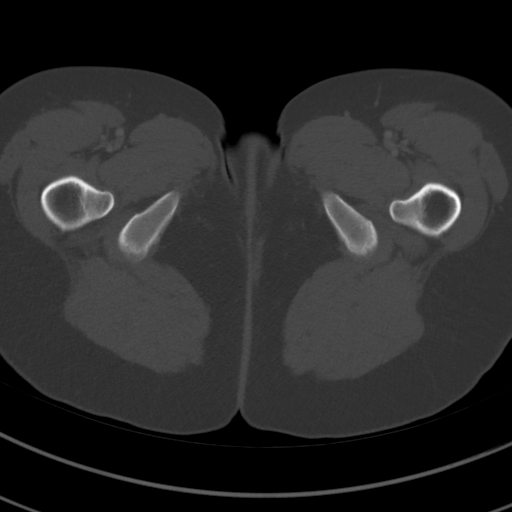
[im 10/86  soft-tissue]
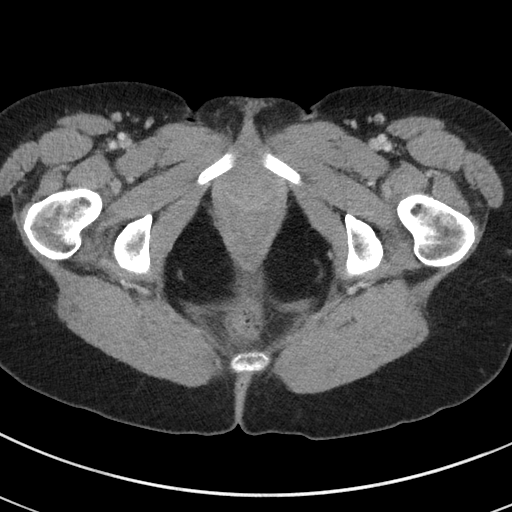
[im 17/86  soft-tissue]
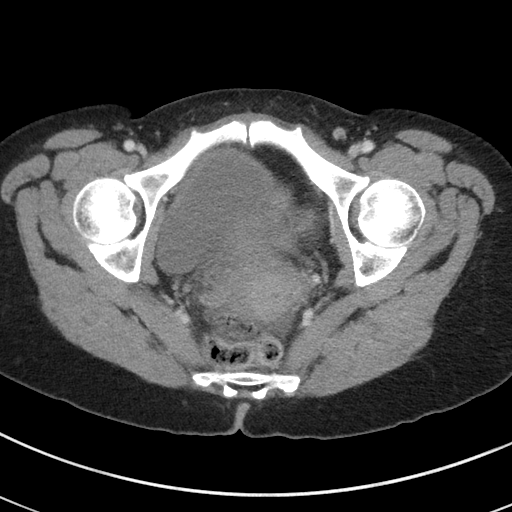
[im 23/86  soft-tissue]
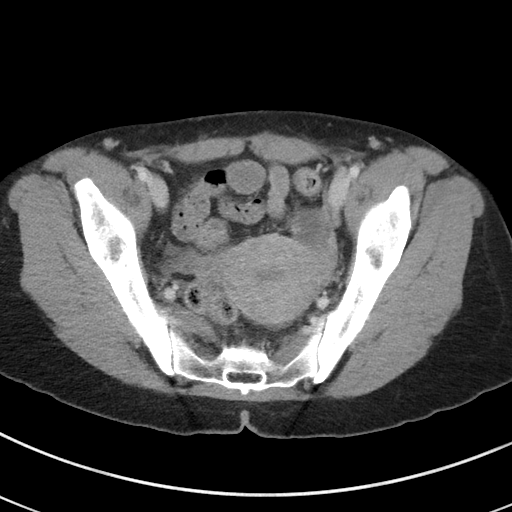
[im 30/86  soft-tissue]
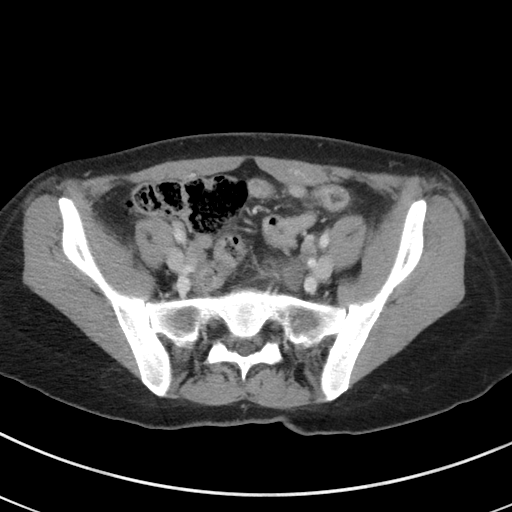
[im 36/86  soft-tissue]
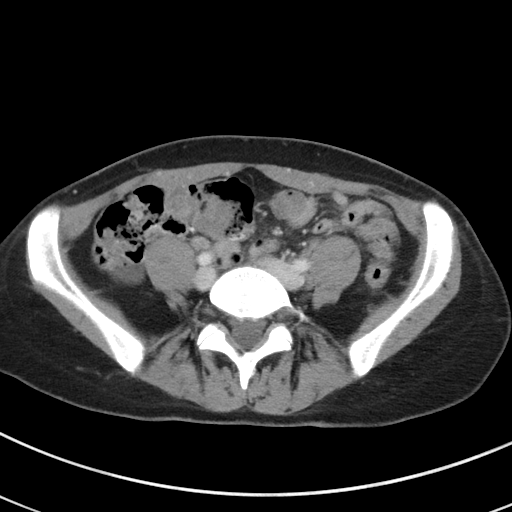
[im 43/86  soft-tissue]
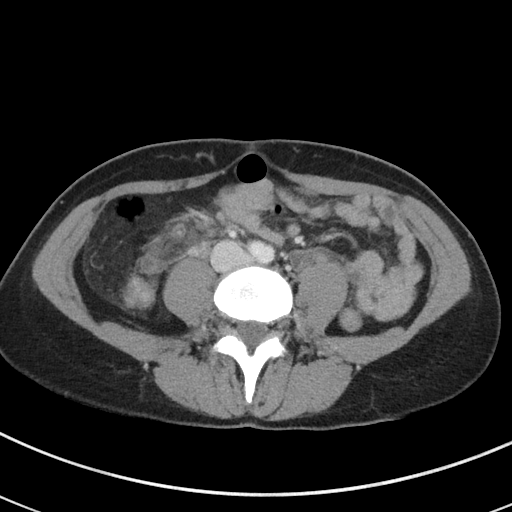
[im 50/86  soft-tissue]
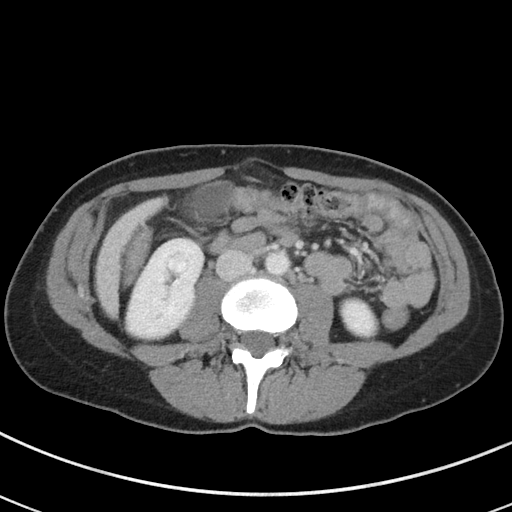
[im 56/86  soft-tissue]
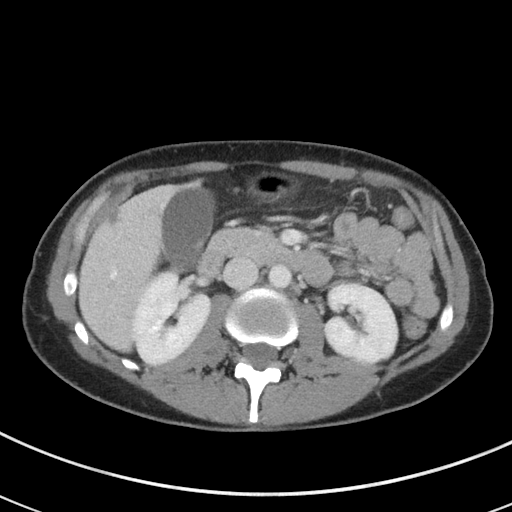
[im 56/86  bone]
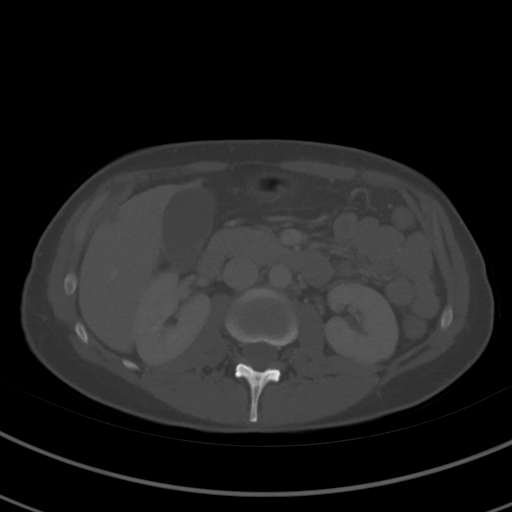
[im 63/86  soft-tissue]
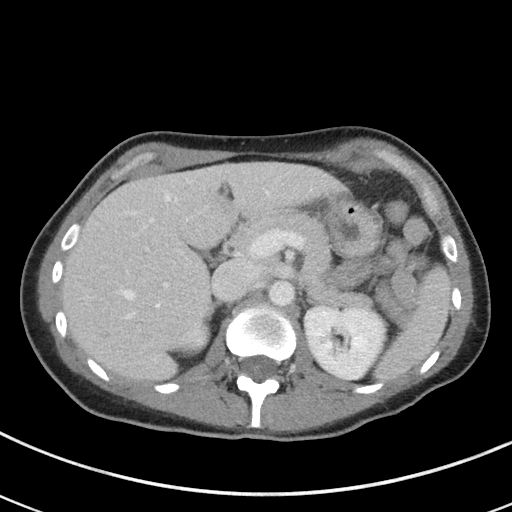
[im 69/86  soft-tissue]
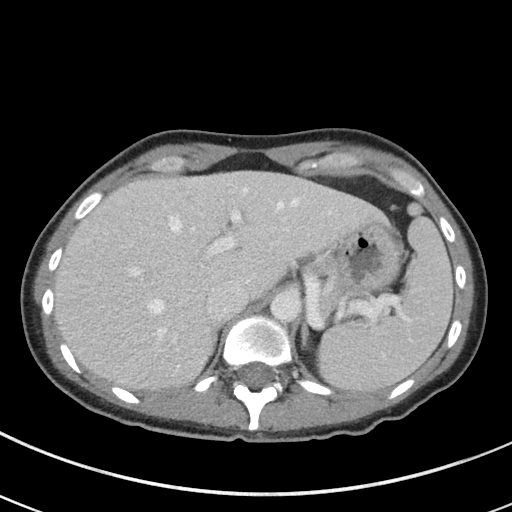
[im 76/86  soft-tissue]
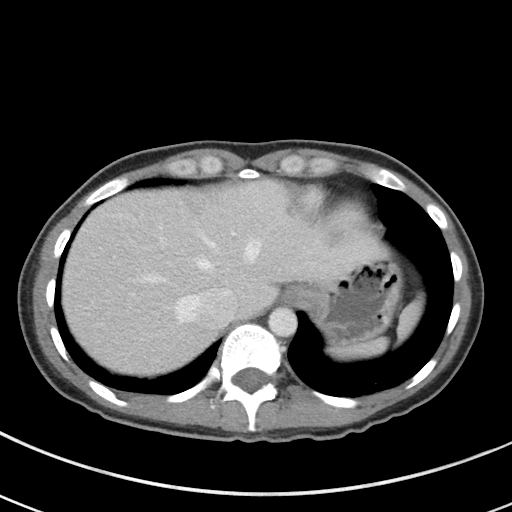
[im 82/86  soft-tissue]
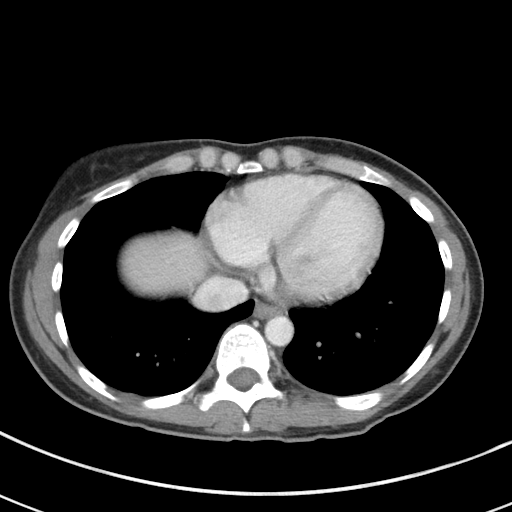

[Series 5: coronal st · coronal · 0.72mm/px · 3 of 69 slices shown]
[im 23/69  soft-tissue]
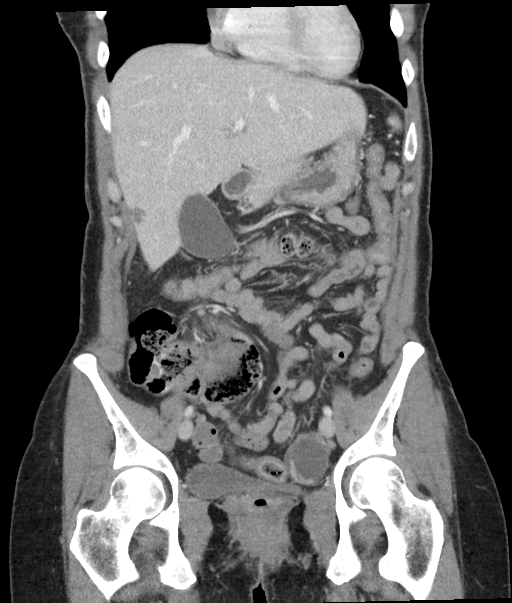
[im 31/69  soft-tissue]
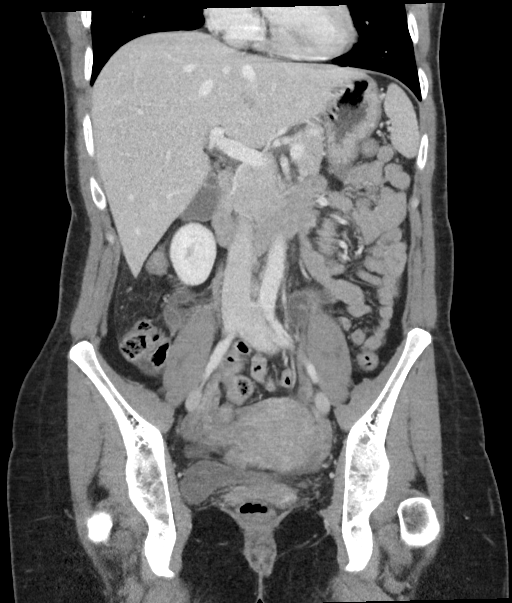
[im 38/69  soft-tissue]
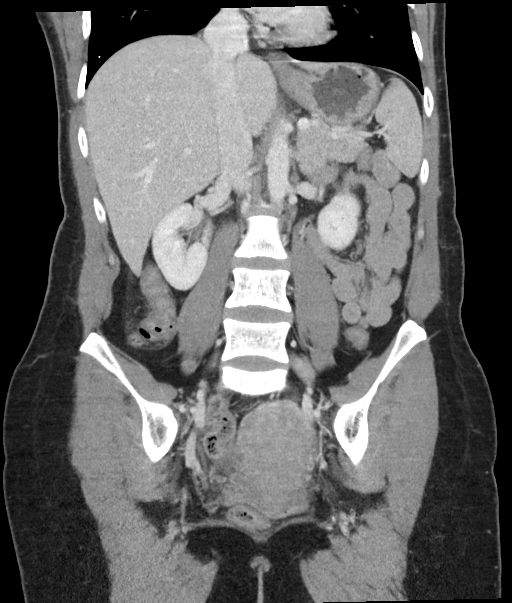

[16 of 46 positions shown; findings below may reference images not displayed]

FINDINGS: Lower chest: The lung bases are clear. No pleural or pericardial
effusion.

Hepatobiliary: Hemangioma in the right hepatic lobe measures 1 cm in
diameter. There is also a hemangioma measuring 1.3 cm in diameter in
the left hepatic lobe. The liver is otherwise unremarkable.
Gallbladder and biliary tree appear normal.

Pancreas: Unremarkable. No pancreatic ductal dilatation or
surrounding inflammatory changes.

Spleen: Normal in size without focal abnormality.

Adrenals/Urinary Tract: Adrenal glands are unremarkable. Kidneys are
normal, without renal calculi, focal lesion, or hydronephrosis.
Bladder is unremarkable.

Stomach/Bowel: The appendix is dilated with extensive
periappendiceal inflammatory change. Two appendicoliths are
identified. The larger measures 1 cm in diameter. There is a small
volume of fluid with partial rim enhancement measuring 0.8 x 1.8 cm
in diameter adjacent to the appendix compatible with phlegmon. The
stomach, small bowel and colon appear normal.

Vascular/Lymphatic: No significant vascular findings are present. No
enlarged abdominal or pelvic lymph nodes.

Reproductive: 3 cm cyst left ovarian cyst is incidentally noted.
Uterus and right ovary are unremarkable.

Other: Small volume of free pelvic fluid is seen.

Musculoskeletal: Negative.
IMPRESSION: The examination is positive for acute appendicitis with a small
volume of partially rim enhancing fluid adjacent to the appendix
compatible with phlegmon.

These results were called by telephone at the time of interpretation
on 12/02/2018 at [DATE] to Dr. BAMBUCAFE TARLA , who verbally
acknowledged these results.

## 2020-01-09 ENCOUNTER — Other Ambulatory Visit: Payer: Self-pay

## 2020-01-09 ENCOUNTER — Ambulatory Visit: Payer: BC Managed Care – PPO | Attending: Internal Medicine

## 2020-01-09 DIAGNOSIS — Z23 Encounter for immunization: Secondary | ICD-10-CM

## 2020-01-09 NOTE — Progress Notes (Signed)
   Covid-19 Vaccination Clinic  Name:  Chelsea Allen    MRN: [id number] DOB: 1980/12/20  01/09/2020  [name] was observed post Covid-19 immunization for 15 minutes without incident. She was provided with Vaccine Information Sheet and instruction to access the V-Safe system.   [name] was instructed to call 911 with any severe reactions post vaccine: Marland Kitchen Difficulty breathing  . Swelling of face and throat  . A fast heartbeat  . A bad rash all over body  . Dizziness and weakness   Immunizations Administered    Name Date Dose VIS Date Route   Pfizer COVID-19 Vaccine 01/09/2020 10:47 AM 0.3 mL 10/16/2019 Intramuscular   Manufacturer: [employer], [employer]   Lot: [id number]   NDC: [address]-9

## 2020-02-02 ENCOUNTER — Ambulatory Visit: Payer: BC Managed Care – PPO | Attending: Internal Medicine

## 2020-02-02 DIAGNOSIS — Z23 Encounter for immunization: Secondary | ICD-10-CM

## 2020-02-02 NOTE — Progress Notes (Signed)
   Covid-19 Vaccination Clinic  Name:  Chelsea Allen    MRN: [id number] DOB: Apr 03, 1981  02/02/2020  [name] was observed post Covid-19 immunization for 15 minutes without incident. She was provided with Vaccine Information Sheet and instruction to access the V-Safe system.   [name] was instructed to call 911 with any severe reactions post vaccine: Marland Kitchen Difficulty breathing  . Swelling of face and throat  . A fast heartbeat  . A bad rash all over body  . Dizziness and weakness   Immunizations Administered    Name Date Dose VIS Date Route   Pfizer COVID-19 Vaccine 02/02/2020  8:19 AM 0.3 mL 10/16/2019 Intramuscular   Manufacturer: [employer], [employer]   Lot: [id number]   NDC: [address]-1

## 2023-02-05 ENCOUNTER — Ambulatory Visit
Admission: EM | Admit: 2023-02-05 | Discharge: 2023-02-05 | Disposition: A | Discharge status: Home / Self Care | Payer: BC Managed Care – PPO | Attending: Urgent Care | Admitting: Urgent Care

## 2023-02-05 DIAGNOSIS — L237 Allergic contact dermatitis due to plants, except food: Secondary | ICD-10-CM

## 2023-02-05 MED ORDER — PREDNISONE 10 MG PO TABS: ORAL_TABLET | ORAL | 0 refills | Status: DC

## 2023-02-05 NOTE — ED Provider Notes (Signed)
Chelsea Allen    CSN: [id number] Arrival date & time: 02/05/23  1911      History   Chief Complaint Chief Complaint  Patient presents with   Poison Ivy    Entered by patient    HPI Chelsea Allen is a 42 y.o. female.    Poison Ivy    Presents urgent care with complaints of rash on bilateral arms after doing yard work over the weekend and exposure to poison ivy.  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  No hx of thrombosis   Urinary tract infection, site not specified     Patient Active Problem List   Diagnosis Date Noted   Acute appendicitis 12/02/2018   Postpartum care following cesarean delivery and BTL (6/1) 04/06/2018   Acute blood loss anemia 04/06/2018   Monochorionic diamniotic twin gestation in third trimester 04/05/2018   Twins, both liveborn 04/05/2018   Prothrombin gene mutation 09/20/2017    Past Surgical History:  Procedure Laterality Date   CESAREAN SECTION MULTI-GESTATIONAL N/A 04/05/2018   Procedure: Primary CESAREAN SECTION MULTI-GESTATIONAL;  Surgeon: Brien Few, MD;  Location: [facility];  Service: Obstetrics;  Laterality: N/A;  EDD: 04/28/18 Allergy: Sulfa   DILATION AND EVACUATION N/A 11/27/2016   Procedure: DILATATION AND EVACUATION with Ultrasound Guidance and Chromosome Studies;  Surgeon: Brien Few, MD;  Location: [facility];  Service: Gynecology;  Laterality: N/A;   LAPAROSCOPIC APPENDECTOMY N/A 12/02/2018   Procedure: APPENDECTOMY LAPAROSCOPIC;  Surgeon: Jules Husbands, MD;   Location: [facility];  Service: General;  Laterality: N/A;   UMBILICAL HERNIA REPAIR  12/02/2018   Procedure: HERNIA REPAIR UMBILICAL ADULT;  Surgeon: Jules Husbands, MD;  Location: [facility];  Service: General;;   WISDOM TOOTH EXTRACTION      OB History     Gravida  6   Para  3   Term  2   Preterm  1   AB  3   Living  4      SAB  3   IAB      Ectopic      Multiple  1   Live Births  4            Home Medications    Prior to Admission medications   Medication Sig Start Date End Date Taking? Authorizing Provider  ibuprofen (ADVIL,MOTRIN) 800 MG tablet Take 1 tablet (800 mg total) by mouth every 8 (eight) hours as needed. Patient not taking: Reported on 02/05/2023 04/08/18   Artelia Laroche, CNM  medroxyPROGESTERone (PROVERA) 10 MG tablet Take 10 mg by mouth every 30 (thirty) days. Patient not taking: Reported on 02/05/2023 11/02/18   [provider]  Multiple Vitamin (MULTIVITAMIN WITH MINERALS) TABS tablet Take 1 tablet by mouth daily. Patient not taking: Reported on 02/05/2023    [provider]    Family History Family History  Problem Relation Age of Onset   Depression Mother    Depression  Sister    Depression Maternal Grandfather    Cancer Paternal Grandfather        lung   Breast cancer Maternal Grandmother     Social History Social History   Tobacco Use   Smoking status: Never   Smokeless tobacco: Never  Vaping Use   Vaping Use: Never used  Substance Use Topics   Alcohol use: No   Drug use: No     Allergies   Beef-derived products, Lambs quarters, Pork-derived products, and Sulfonamide derivatives   Review of Systems Review of Systems   Physical Exam Triage Vital Signs ED Triage Vitals  Enc Vitals Group     BP 02/05/23 1921 122/85     Pulse Rate 02/05/23 1921 74     Resp 02/05/23 1921 18     Temp 02/05/23 1921 98 F (36.7 C)     Temp src --      SpO2 02/05/23 1921 98 %     Weight 02/05/23 1920 125 lb (56.7 kg)      Height 02/05/23 1920 5\' 4"  (1.626 m)     Head Circumference --      Peak Flow --      Pain Score --      Pain Loc --      Pain Edu? --      Excl. in [place]? --    No data found.  Updated Vital Signs BP 122/85   Pulse 74   Temp 98 F (36.7 C)   Resp 18   Ht 5\' 4"  (1.626 m)   Wt 125 lb (56.7 kg)   LMP 01/20/2023   SpO2 98%   BMI 21.46 kg/m   Visual Acuity Right Eye Distance:   Left Eye Distance:   Bilateral Distance:    Right Eye Near:   Left Eye Near:    Bilateral Near:     Physical Exam Vitals reviewed.  Constitutional:      Appearance: Normal appearance.  Skin:    General: Skin is warm and dry.     Findings: Erythema and rash present.  Neurological:     General: No focal deficit present.     Mental Status: She is alert and oriented to person, place, and time.  Psychiatric:        Mood and Affect: Mood normal.        Behavior: Behavior normal.      UC Treatments / Results  Labs (all labs ordered are listed, but only abnormal results are displayed) Labs Reviewed - No data to display  EKG   Radiology No results found.  Procedures Procedures (including critical care time)  Medications Ordered in UC Medications - No data to display  Initial Impression / Assessment and Plan / UC Course  I have reviewed the triage vital signs and the nursing notes.  Pertinent labs & imaging results that were available during my care of the patient were reviewed by me and considered in my medical decision making (see chart for details).   Erythematous rash on bilateral arms.  Very pruritic.  Recommended use of topical versus oral corticosteroid.  Patient requests prescription for oral steroid.  Acknowledges understanding and agreement with treatment plan.   Final Clinical Impressions(s) / UC Diagnoses   Final diagnoses:  None   Discharge Instructions   None    ED Prescriptions   None    PDMP not reviewed this encounter.   Rose Phi, [place] 02/05/23  [phone]

## 2023-02-05 NOTE — ED Triage Notes (Signed)
Patient to Urgent Care with complaints of rash present to bilateral arms after doing yard work over the weekend and being exposed to [place].  Has been using a salve but thinks this is worsening the rash. Has had some relief with apple cider vinegar.

## 2023-02-05 NOTE — Discharge Instructions (Signed)
Follow up here or with your primary care provider if your symptoms are worsening or not improving.     

## 2023-05-12 ENCOUNTER — Telehealth: Payer: BC Managed Care – PPO | Admitting: Family Medicine

## 2023-05-12 ENCOUNTER — Ambulatory Visit
Admission: EM | Admit: 2023-05-12 | Discharge: 2023-05-12 | Disposition: A | Discharge status: Home / Self Care | Payer: BC Managed Care – PPO | Attending: Urgent Care | Admitting: Urgent Care

## 2023-05-12 DIAGNOSIS — Z91038 Other insect allergy status: Secondary | ICD-10-CM | POA: Diagnosis not present

## 2023-05-12 MED ORDER — PREDNISONE 10 MG (21) PO TBPK: ORAL_TABLET | Freq: Every day | ORAL | 0 refills | Status: AC

## 2023-05-12 NOTE — Discharge Instructions (Signed)
Mark the borders of the area of redness to day and if you notice advancing redness.  Return for additional treatment if the rash is worsening after 1 to 2 days of treatment with prednisone.  Continue to use oral antihistamine such as OTC cetirizine, loratadine, or fexofenadine.  Go to the ED if you develop severe symptoms including difficulty breathing, speaking, swallowing.

## 2023-05-12 NOTE — ED Triage Notes (Addendum)
Pt states she got stung by insect (to right hand) on Friday and began having swelling to the hand/arm. Area is warm to touch, with redness, and swelling noted.   Home interventions: benadryl cream, Advil

## 2023-05-12 NOTE — ED Provider Notes (Signed)
Renaldo Fiddler    CSN: [id number] Arrival date & time: 05/12/23  1017      History   Chief Complaint Chief Complaint  Patient presents with   Allergic Reaction    Sting by hornet - Entered by patient   hand swelling    HPI Chelsea Allen is a 42 y.o. female.    Allergic Reaction   Presents to urgent care with concern for reaction to insect sting which occurred 2 days ago.  She endorses worsening swelling, warmth, redness.  Has been using Benadryl orally, cream topically as well as Advil to treat her symptoms.   Past Medical History:  Diagnosis Date   Anemia    Anorexia nervosa with bulimia in college   none in 10+ years   Depression    H/O varicella    Headache    Migraines   Maternal anemia, with delivery 07/22/2012   Mitral valve prolapse 2004   Other disorder of muscle, ligament, and fascia    IITS-left knee   Personal history of other mental disorder    anorexia nervosa   PONV (postoperative nausea and vomiting)    Prothrombin gene mutation (HCC) 09/20/2017   Heterozygote  12/24/16  No hx of thrombosis   Urinary tract infection, site not specified     Patient Active Problem List   Diagnosis Date Noted   Acute appendicitis 12/02/2018   Postpartum care following cesarean delivery and BTL (6/1) 04/06/2018   Acute blood loss anemia 04/06/2018   Monochorionic diamniotic twin gestation in third trimester 04/05/2018   Twins, both liveborn 04/05/2018   Prothrombin gene mutation (HCC) 09/20/2017    Past Surgical History:  Procedure Laterality Date   CESAREAN SECTION MULTI-GESTATIONAL N/A 04/05/2018   Procedure: Primary CESAREAN SECTION MULTI-GESTATIONAL;  Surgeon: Olivia Mackie, MD;  Location: [facility];  Service: Obstetrics;  Laterality: N/A;  EDD: 04/28/18 Allergy: Sulfa   DILATION AND EVACUATION N/A 11/27/2016   Procedure: DILATATION AND EVACUATION with Ultrasound Guidance and Chromosome Studies;  Surgeon: Olivia Mackie, MD;  Location: [facility];  Service: Gynecology;  Laterality: N/A;   LAPAROSCOPIC APPENDECTOMY N/A 12/02/2018   Procedure: APPENDECTOMY LAPAROSCOPIC;  Surgeon: Leafy Ro, MD;  Location: [facility];  Service: General;  Laterality: N/A;   UMBILICAL HERNIA REPAIR  12/02/2018   Procedure: HERNIA REPAIR UMBILICAL ADULT;  Surgeon: Leafy Ro, MD;  Location: [facility];  Service: General;;   WISDOM TOOTH EXTRACTION      OB History     Gravida  6   Para  3   Term  2   Preterm  1   AB  3   Living  4      SAB  3   IAB      Ectopic      Multiple  1   Live Births  4            Home Medications    Prior to Admission medications   Medication Sig Start Date End Date Taking? Authorizing Provider  ibuprofen (ADVIL,MOTRIN) 800 MG tablet Take 1 tablet (800 mg total) by mouth every 8 (eight) hours as needed. Patient not taking: Reported on 02/05/2023 04/08/18   Marlinda Mike, CNM  medroxyPROGESTERone (PROVERA) 10 MG tablet Take 10 mg by mouth every 30 (thirty) days. Patient not taking: Reported on 02/05/2023 11/02/18   [provider]  Multiple Vitamin (MULTIVITAMIN WITH MINERALS) TABS tablet Take 1 tablet by mouth daily. Patient not taking: Reported on 02/05/2023  [provider]  predniSONE (DELTASONE) 10 MG tablet Take 4 tablets (40 mg) for 5 days, then 2 tablets (20 mg) for 5 days, then 1 tablet (10 mg) for 5 days. 02/05/23   Toshiko Kemler, Jeannett Senior, FNP    Family History Family History  Problem Relation Age of Onset   Depression Mother    Depression Sister    Depression Maternal Grandfather    Cancer Paternal Grandfather        lung   Breast cancer Maternal Grandmother     Social History Social History   Tobacco Use   Smoking status: Never   Smokeless tobacco: Never  Vaping Use   Vaping Use: Never used  Substance Use Topics   Alcohol use: No   Drug use: No     Allergies   Beef-derived products, Lambs quarters, Pork-derived products, and Sulfonamide  derivatives   Review of Systems Review of Systems   Physical Exam Triage Vital Signs ED Triage Vitals  Enc Vitals Group     BP 05/12/23 1031 115/78     Pulse Rate 05/12/23 1031 66     Resp 05/12/23 1031 18     Temp 05/12/23 1031 98.9 F (37.2 C)     Temp Source 05/12/23 1031 Oral     SpO2 05/12/23 1031 98 %     Weight --      Height --      Head Circumference --      Peak Flow --      Pain Score 05/12/23 1030 2     Pain Loc --      Pain Edu? --      Excl. in [place]? --    No data found.  Updated Vital Signs BP 115/78 (BP Location: Left Arm)   Pulse 66   Temp 98.9 F (37.2 C) (Oral)   Resp 18   LMP 05/09/2023   SpO2 98%   Visual Acuity Right Eye Distance:   Left Eye Distance:   Bilateral Distance:    Right Eye Near:   Left Eye Near:    Bilateral Near:     Physical Exam Vitals reviewed.  Constitutional:      Appearance: Normal appearance.  Skin:    General: Skin is warm and dry.     Findings: Rash present.       Neurological:     General: No focal deficit present.     Mental Status: She is alert and oriented to person, place, and time.  Psychiatric:        Mood and Affect: Mood normal.        Behavior: Behavior normal.      UC Treatments / Results  Labs (all labs ordered are listed, but only abnormal results are displayed) Labs Reviewed - No data to display  EKG   Radiology No results found.  Procedures Procedures (including critical care time)  Medications Ordered in UC Medications - No data to display  Initial Impression / Assessment and Plan / UC Course  I have reviewed the triage vital signs and the nursing notes.  Pertinent labs & imaging results that were available during my care of the patient were reviewed by me and considered in my medical decision making (see chart for details).   Chelsea Allen is a 42 y.o. female presenting with reaction to presumed insect bite. Patient is afebrile without recent antipyretics, satting  well on room air. Overall is well appearing though non-toxic, well hydrated, without respiratory distress. Erythema,  edema, warmth present on the patient's right hand, wrist, and forearm.  Not cellulitic.  No discharge.  Reviewed relevant chart history.   Local inflammatory reaction to presumed insect bite.  Area of erythema seems to be spreading after 2 days and so will treat with oral corticosteroids which the patient has tolerated in the past.  Counseled patient on potential for adverse effects with medications prescribed/recommended today, ER and return-to-clinic precautions discussed, patient verbalized understanding and agreement with care plan.  Final Clinical Impressions(s) / UC Diagnoses   Final diagnoses:  None   Discharge Instructions   None    ED Prescriptions   None    PDMP not reviewed this encounter.   Charma Igo, [place] 05/12/23 1047

## 2023-05-12 NOTE — Progress Notes (Signed)
Pt did not show for visit after multiple attempts. DWB
